# Patient Record
Sex: Female | Born: 1975 | Race: Black or African American | Hispanic: No | Smoking: Current every day smoker
Health system: Southern US, Community
[De-identification: ages and names within clinical notes are randomized; demographics above are authoritative.]

## PROBLEM LIST (undated history)

## (undated) DIAGNOSIS — J40 Bronchitis, not specified as acute or chronic: Secondary | ICD-10-CM

## (undated) DIAGNOSIS — I10 Essential (primary) hypertension: Secondary | ICD-10-CM

---

## 2017-05-27 ENCOUNTER — Encounter (HOSPITAL_COMMUNITY): Payer: Self-pay

## 2017-05-27 ENCOUNTER — Emergency Department (HOSPITAL_COMMUNITY)
Admission: EM | Admit: 2017-05-27 | Discharge: 2017-05-27 | Disposition: A | Discharge status: Home / Self Care | Payer: Self-pay | Attending: Emergency Medicine | Admitting: Emergency Medicine

## 2017-05-27 DIAGNOSIS — F17200 Nicotine dependence, unspecified, uncomplicated: Secondary | ICD-10-CM | POA: Insufficient documentation

## 2017-05-27 DIAGNOSIS — I10 Essential (primary) hypertension: Secondary | ICD-10-CM | POA: Insufficient documentation

## 2017-05-27 DIAGNOSIS — B86 Scabies: Secondary | ICD-10-CM

## 2017-05-27 HISTORY — DX: Essential (primary) hypertension: I10

## 2017-05-27 MED ORDER — DIPHENHYDRAMINE HCL 25 MG PO CAPS
25.0000 mg | ORAL_CAPSULE | Freq: Once | ORAL | Status: AC
  Administered 2017-05-27: 25 mg via ORAL
  Filled 2017-05-27: qty 1

## 2017-05-27 MED ORDER — PERMETHRIN 5 % EX CREA: TOPICAL_CREAM | CUTANEOUS | 0 refills | Status: DC

## 2017-05-27 MED ORDER — DEXAMETHASONE 4 MG PO TABS
10.0000 mg | ORAL_TABLET | Freq: Once | ORAL | Status: AC
  Administered 2017-05-27: 10 mg via ORAL
  Filled 2017-05-27: qty 3

## 2017-05-27 MED ORDER — PREDNISONE 20 MG PO TABS: 60.0000 mg | ORAL_TABLET | Freq: Once | ORAL | Status: DC

## 2017-05-27 MED ORDER — HYDROCHLOROTHIAZIDE 25 MG PO TABS
25.0000 mg | ORAL_TABLET | Freq: Every day | ORAL | Status: DC
  Administered 2017-05-27: 25 mg via ORAL
  Filled 2017-05-27: qty 1

## 2017-05-27 MED ORDER — FAMOTIDINE 20 MG PO TABS: 40.0000 mg | ORAL_TABLET | Freq: Once | ORAL | Status: DC

## 2017-05-27 NOTE — ED Triage Notes (Signed)
Pt reports rash to face, bilateral arms, neck since Thursday after the dust mopped at work. PT denies new laundry detergent, lotion, medication. Endorses new soap 2 weeks. bp 203/100 in triage but pt states she hasnt taken medication in 2 days,

## 2017-05-27 NOTE — Discharge Instructions (Addendum)
Please take medication as prescribed.  Follow attached handout.   Your blood pressure was elevated during today's visit. Please discuss this with your PCP during your follow-up appointment to determine if a medication adjustment/addition is needed for this   If you develop worsening or new concerning symptoms you can return to the emergency department for re-evaluation.

## 2017-05-27 NOTE — ED Provider Notes (Signed)
[facility] EMERGENCY DEPARTMENT Provider Note   CSN: [id number] Arrival date & time: 05/27/17  1214     History   Chief Complaint Chief Complaint  Patient presents with  . Allergic Reaction    HPI Martha Garza is a 42 y.o. female with a history of hypertension who presents emergency department today for rash.  Patient notes that she recently started using a new soap.  Since that time she has been noticing hives that have been occurring on her bilateral arms, chest, neck and occasionally on her face.  She notes that the areas are very pruritic and often worse at night.  She has not been taking anything for symptoms.  Denies fever, chills, contacts with persons with similar rash, exposure to animal or plant irritants. No swelling or purulent discharge. No new medications. No recent travel. No recent tick bites. No involvement to palms/soles or between webspaces.  Patient denies any difficulty breathing, lip swelling, tongue swelling, difficulty swallowing, shortness of breath, chest tightness, abdominal cramping, vomiting or diarrhea.  HPI  Past Medical History:  Diagnosis Date  . Hypertension     There are no active problems to display for this patient.   History reviewed. No pertinent surgical history.   OB History   None      Home Medications    Prior to Admission medications   Not on File    Family History No family history on file.  Social History Social History   Tobacco Use  . Smoking status: Current Every Day Smoker  . Smokeless tobacco: Never Used  Substance Use Topics  . Alcohol use: Yes  . Drug use: Not on file     Allergies   Penicillins   Review of Systems Review of Systems  All other systems reviewed and are negative.    Physical Exam Updated Vital Signs BP (!) 203/100   Pulse 69   Temp 98.3 F (36.8 C) (Oral)   Resp 16   LMP 05/26/2017   SpO2 100%   Physical Exam  Constitutional: She appears  well-developed and well-nourished.  HENT:  Head: Normocephalic and atraumatic.  Right Ear: External ear normal.  Left Ear: External ear normal.  Nose: Nose normal.  Mouth/Throat: Uvula is midline, oropharynx is clear and moist and mucous membranes are normal. No tonsillar exudate.  Patient speaking in full sentences without difficulty.  She is in control of her secretions.  No lip swelling, tongue swelling or uvular swelling.  No angioedema.  Eyes: Pupils are equal, round, and reactive to light. Right eye exhibits no discharge. Left eye exhibits no discharge. No scleral icterus.  Neck: Trachea normal. Neck supple. No spinous process tenderness present. No neck rigidity. Normal range of motion present.  Cardiovascular: Normal rate, regular rhythm and intact distal pulses.  No murmur heard. Pulses:      Radial pulses are 2+ on the right side, and 2+ on the left side.       Dorsalis pedis pulses are 2+ on the right side, and 2+ on the left side.       Posterior tibial pulses are 2+ on the right side, and 2+ on the left side.  No lower extremity swelling or edema. Calves symmetric in size bilaterally.  Pulmonary/Chest: Effort normal and breath sounds normal. She exhibits no tenderness.  No increased work of breathing. No accessory muscle use. Patient is sitting upright, speaking in full sentences without difficulty  Abdominal: Soft. Bowel sounds are normal. There is no  tenderness. There is no rebound and no guarding.  Musculoskeletal: She exhibits no edema.  Lymphadenopathy:    She has no cervical adenopathy.  Neurological: She is alert.  Skin: Skin is warm and dry. No rash noted. She is not diaphoretic.  Patient with psoriasis-like changes on posterior elbows.  She has eczema-like changes on bilateral antecubital areas.  She states this is chronic.  No overlying infectious-like changes.  Patient with burrows between webspaces. Patient with generalized eruption with linear burrows across  bilateral forearms.  This does not involve other areas of the patient's skin.  No blisters, no pustules, no warmth, no draining sinus tracts, no superficial abscesses, no bullous impetigo, no vesicles, no desquamation, no target lesions with dusky purpura or a central bulla. No excoriations.  Not tender to touch.  Psychiatric: She has a normal mood and affect.  Nursing note and vitals reviewed.    ED Treatments / Results  Labs (all labs ordered are listed, but only abnormal results are displayed) Labs Reviewed - No data to display  EKG None  Radiology No results found.  Procedures Procedures (including critical care time)  Medications Ordered in ED Medications  hydrochlorothiazide (HYDRODIURIL) tablet 25 mg (has no administration in time range)     Initial Impression / Assessment and Plan / ED Course  I have reviewed the triage vital signs and the nursing notes.  Pertinent labs & imaging results that were available during my care of the patient were reviewed by me and considered in my medical decision making (see chart for details).     Rash consistent with scabies. Will tx with permethrin. Patient denies any difficulty breathing or swallowing.  Pt has a patent airway without stridor and is handling secretions without difficulty; no angioedema. No blisters, no pustules, no warmth, no draining sinus tracts, no superficial abscesses, no bullous impetigo, no vesicles, no desquamation, no target lesions with dusky purpura or a central bulla. Not tender to touch. No concern for superimposed infection. No concern for SJS, TEN, TSS, tick borne illness, syphilis or other life-threatening condition.  Patient educated on diagnosis.  Will give atarax for itching. I advised the patient to follow-up with PCP this week. Specific return precautions discussed. Time was given for all questions to be answered. The patient verbalized understanding and agreement with plan. The patient appears safe for  discharge home.  Patient was noted to have an elevated blood pressure during their stay in the emergency department. The patient has a history of high blood pressure.  Patient has not taking her blood pressure medication this morning.  She denies any headache, chest pain, shortness of breath or neurologic symptoms.  Patient without any focal neurologic deficits. Patient given dose of bp medication while in the department. I advised her to make sure she takes bp medication as prescribed. The patient to discuss this with their PCP during her follow up visit to decide if medication management is needed for this. Patient does not need refill on her medication   Final Clinical Impressions(s) / ED Diagnoses   Final diagnoses:  Scabies    ED Discharge Orders        Ordered    permethrin (ELIMITE) 5 % cream     05/27/17 1529       Lorelle Gibbs 05/27/17 1607    Gareth Morgan, MD 05/29/17 2253

## 2017-11-05 ENCOUNTER — Other Ambulatory Visit: Payer: Self-pay

## 2017-11-05 ENCOUNTER — Encounter (HOSPITAL_BASED_OUTPATIENT_CLINIC_OR_DEPARTMENT_OTHER): Payer: Self-pay | Admitting: Emergency Medicine

## 2017-11-05 ENCOUNTER — Emergency Department (HOSPITAL_BASED_OUTPATIENT_CLINIC_OR_DEPARTMENT_OTHER): Payer: Self-pay

## 2017-11-05 ENCOUNTER — Emergency Department (HOSPITAL_BASED_OUTPATIENT_CLINIC_OR_DEPARTMENT_OTHER)
Admission: EM | Admit: 2017-11-05 | Discharge: 2017-11-05 | Disposition: A | Discharge status: Home / Self Care | Payer: Self-pay | Attending: Emergency Medicine | Admitting: Emergency Medicine

## 2017-11-05 DIAGNOSIS — B9789 Other viral agents as the cause of diseases classified elsewhere: Secondary | ICD-10-CM | POA: Insufficient documentation

## 2017-11-05 DIAGNOSIS — J069 Acute upper respiratory infection, unspecified: Secondary | ICD-10-CM

## 2017-11-05 DIAGNOSIS — F17200 Nicotine dependence, unspecified, uncomplicated: Secondary | ICD-10-CM | POA: Insufficient documentation

## 2017-11-05 DIAGNOSIS — I10 Essential (primary) hypertension: Secondary | ICD-10-CM | POA: Insufficient documentation

## 2017-11-05 DIAGNOSIS — J40 Bronchitis, not specified as acute or chronic: Secondary | ICD-10-CM

## 2017-11-05 HISTORY — DX: Bronchitis, not specified as acute or chronic: J40

## 2017-11-05 MED ORDER — ALBUTEROL SULFATE (2.5 MG/3ML) 0.083% IN NEBU
5.0000 mg | INHALATION_SOLUTION | Freq: Once | RESPIRATORY_TRACT | Status: AC
  Administered 2017-11-05: 5 mg via RESPIRATORY_TRACT
  Filled 2017-11-05: qty 6

## 2017-11-05 MED ORDER — PREDNISONE 10 MG PO TABS
60.0000 mg | ORAL_TABLET | Freq: Once | ORAL | Status: AC
  Administered 2017-11-05: 60 mg via ORAL
  Filled 2017-11-05: qty 1

## 2017-11-05 MED ORDER — PREDNISONE 10 MG PO TABS: 50.0000 mg | ORAL_TABLET | Freq: Every day | ORAL | 0 refills | Status: DC

## 2017-11-05 NOTE — ED Triage Notes (Addendum)
Pt reports one week of cold symptoms with shortness of breath and chest pain. Pt seen at [facility] online and was prescribed medications. Pt continued to have chest pain the online MD changed the antibiotic. Pt continues to have shortness of breath and chest pain that is worse at night. Pt has not taking her BP meds in a year, pt does not have a PCP.

## 2017-11-05 NOTE — Discharge Instructions (Signed)
Take the medications prescribed.  Continue using albuterol every 4 hours for the next 2 to 3 days.  Take additional treatments if you are having shortness of breath in between.  Also finished the course of prednisone that has been prescribed.  Return to the ER immediately if you start having worsening of your symptoms.

## 2017-11-05 NOTE — ED Notes (Signed)
ED Provider at bedside. 

## 2017-11-10 NOTE — ED Provider Notes (Signed)
[facility] EMERGENCY DEPARTMENT Provider Note   CSN: [id number] Arrival date & time: 11/05/17  1127     History   Chief Complaint Chief Complaint  Patient presents with  . Cough  . Chest Pain    HPI Martha Garza is a 42 y.o. female.  HPI  42 year old [place] with history of bronchitis and hypertension comes in with chief complaint of cough.  Patient reports 1 week of cold-like symptoms.  She is also been having associated shortness of breath and chest discomfort.  With the cough there has been some clear and yellow phlegm.  Patient has seen her PCP and prescribed medication that she has been taking, including antibiotics.  Patient continues to have the chest pain and shortness of breath.  Pt has no hx of PE, DVT and denies any exogenous hormone (testosterone / estrogen) use, long distance travels or surgery in the past 6 weeks, active cancer, recent immobilization.   Past Medical History:  Diagnosis Date  . Bronchitis   . Hypertension     There are no active problems to display for this patient.   History reviewed. No pertinent surgical history.   OB History   None      Home Medications    Prior to Admission medications   Medication Sig Start Date End Date Taking? Authorizing Provider  permethrin (ELIMITE) 5 % cream Apply to affected area once Apply from neck down Leave on for 8-12hr before washing off 05/27/17   Maczis, Barth Kirks, PA-C  predniSONE (DELTASONE) 10 MG tablet Take 5 tablets (50 mg total) by mouth daily. 11/05/17   Varney Biles, MD    Family History No family history on file.  Social History Social History   Tobacco Use  . Smoking status: Current Every Day Smoker  . Smokeless tobacco: Never Used  Substance Use Topics  . Alcohol use: Yes  . Drug use: Never     Allergies   Penicillins   Review of Systems Review of Systems  Constitutional: Positive for activity change.  HENT: Positive for congestion and rhinorrhea.     Eyes: Negative for visual disturbance.  Respiratory: Positive for shortness of breath.   Cardiovascular: Positive for chest pain.  Allergic/Immunologic: Negative for immunocompromised state.  All other systems reviewed and are negative.    Physical Exam Updated Vital Signs BP (!) 183/72 (BP Location: Right Arm) Comment: Pt recently completed neb treatment  Pulse 79   Temp 98.7 F (37.1 C) (Oral)   Resp 18   Ht 5\' 5"  (1.651 m)   Wt 108.9 kg   LMP 10/18/2017   SpO2 98%   BMI 39.94 kg/m   Physical Exam  Constitutional: She is oriented to person, place, and time. She appears well-developed.  HENT:  Head: Normocephalic and atraumatic.  Eyes: EOM are normal.  Neck: Normal range of motion. Neck supple.  Cardiovascular: Normal rate, intact distal pulses and normal pulses.  Pulmonary/Chest: Effort normal. She has no decreased breath sounds. She has no wheezes. She has no rhonchi. She has no rales.  Abdominal: Bowel sounds are normal.  Neurological: She is alert and oriented to person, place, and time.  Skin: Skin is warm and dry.  Nursing note and vitals reviewed.    ED Treatments / Results  Labs (all labs ordered are listed, but only abnormal results are displayed) Labs Reviewed - No data to display  EKG EKG Interpretation  Date/Time:  Sunday November 05 2017 11:48:21 EDT Ventricular Rate:  75 PR Interval:  166 QRS Duration: 84 QT Interval:  408 QTC Calculation: 455 R Axis:   56 Text Interpretation:  Normal sinus rhythm Normal ECG No acute changes No old tracing to compare Confirmed by Varney Biles [phone]) on 11/05/2017 1:41:14 PM   Radiology No results found.  Procedures Procedures (including critical care time)  Medications Ordered in ED Medications  albuterol (PROVENTIL) (2.5 MG/3ML) 0.083% nebulizer solution 5 mg (5 mg Nebulization Given 11/05/17 1408)  predniSONE (DELTASONE) tablet 60 mg (60 mg Oral Given 11/05/17 1404)     Initial Impression /  Assessment and Plan / ED Course  I have reviewed the triage vital signs and the nursing notes.  Pertinent labs & imaging results that were available during my care of the patient were reviewed by me and considered in my medical decision making (see chart for details).     DDX includes: Viral syndrome Influenza Pharyngitis Sinusitis Electrolyte abnormality  Patient appears stable.  She has congestion and URI-like symptoms.  X-ray is not showing pneumonia.  We will not change her antibiotics for the regimen.  Final Clinical Impressions(s) / ED Diagnoses   Final diagnoses:  Viral URI with cough  Bronchitis    ED Discharge Orders         Ordered    predniSONE (DELTASONE) 10 MG tablet  Daily     11/05/17 1416           Varney Biles, MD 11/10/17 0041

## 2018-02-07 ENCOUNTER — Encounter (HOSPITAL_BASED_OUTPATIENT_CLINIC_OR_DEPARTMENT_OTHER): Payer: Self-pay | Admitting: *Deleted

## 2018-02-07 ENCOUNTER — Emergency Department (HOSPITAL_BASED_OUTPATIENT_CLINIC_OR_DEPARTMENT_OTHER): Payer: Self-pay

## 2018-02-07 ENCOUNTER — Emergency Department (HOSPITAL_BASED_OUTPATIENT_CLINIC_OR_DEPARTMENT_OTHER)
Admission: EM | Admit: 2018-02-07 | Discharge: 2018-02-07 | Disposition: A | Discharge status: Home / Self Care | Payer: Self-pay | Attending: Emergency Medicine | Admitting: Emergency Medicine

## 2018-02-07 ENCOUNTER — Other Ambulatory Visit: Payer: Self-pay

## 2018-02-07 DIAGNOSIS — I10 Essential (primary) hypertension: Secondary | ICD-10-CM | POA: Insufficient documentation

## 2018-02-07 DIAGNOSIS — F17200 Nicotine dependence, unspecified, uncomplicated: Secondary | ICD-10-CM | POA: Insufficient documentation

## 2018-02-07 DIAGNOSIS — B9789 Other viral agents as the cause of diseases classified elsewhere: Secondary | ICD-10-CM | POA: Insufficient documentation

## 2018-02-07 DIAGNOSIS — J069 Acute upper respiratory infection, unspecified: Secondary | ICD-10-CM

## 2018-02-07 DIAGNOSIS — J40 Bronchitis, not specified as acute or chronic: Secondary | ICD-10-CM

## 2018-02-07 MED ORDER — PREDNISONE 20 MG PO TABS: 40.0000 mg | ORAL_TABLET | Freq: Every day | ORAL | 0 refills | Status: AC

## 2018-02-07 MED ORDER — PREDNISONE 50 MG PO TABS
60.0000 mg | ORAL_TABLET | Freq: Once | ORAL | Status: AC
  Administered 2018-02-07: 60 mg via ORAL
  Filled 2018-02-07: qty 1

## 2018-02-07 MED ORDER — ALBUTEROL SULFATE HFA 108 (90 BASE) MCG/ACT IN AERS
2.0000 | INHALATION_SPRAY | Freq: Once | RESPIRATORY_TRACT | Status: AC
  Administered 2018-02-07: 2 via RESPIRATORY_TRACT
  Filled 2018-02-07: qty 6.7

## 2018-02-07 NOTE — ED Triage Notes (Signed)
C/o cough, congestion body aches onset 01/24/18  Woke today w rt side cp, started  On z pak yesterday

## 2018-02-07 NOTE — Discharge Instructions (Signed)
We are prescribing 40 mg of prednisone that you will start tomorrow. Continue with albuterol as needed for your wheezing. You can do ibuprofen, naproxen or tylenol for the chest pain. You can stop the Zpack given normal chest x ray. Follow up with your primary care provider as needed.

## 2018-02-07 NOTE — ED Provider Notes (Signed)
[facility] EMERGENCY DEPARTMENT Provider Note   CSN: [id number] Arrival date & time: 02/07/18  3500    History   Chief Complaint Chief Complaint  Patient presents with  . Cough    HPI Martha Garza is a 43 y.o. female with a past medical history significant for HTN who presents today for chest pain. Patient reports she woke up this morning with pain in the middle of her chest. Patient states she has had a bad cough for the past two weeks worse at night. She was diagnosed with the flu on christmas day. She has been taking tylenol, robitussin for her cough, Mucinex for her cough and albuterol as needed for wheezing. She reports she was prescribed  Z-pack yesterday through  Telemedicine and has taken the first dose yesterday. Her chest pain is non radiating, and is not associated with exertion, shortness of breath, diaphoresis, abdominal pain or dizziness   HPI  Past Medical History:  Diagnosis Date  . Bronchitis   . Hypertension     There are no active problems to display for this patient.   No past surgical history on file.   OB History   No obstetric history on file.      Home Medications    Prior to Admission medications   Medication Sig Start Date End Date Taking? Authorizing Provider  permethrin (ELIMITE) 5 % cream Apply to affected area once Apply from neck down Leave on for 8-12hr before washing off 05/27/17   Maczis, Barth Kirks, PA-C  predniSONE (DELTASONE) 10 MG tablet Take 5 tablets (50 mg total) by mouth daily. 11/05/17   Varney Biles, MD  predniSONE (DELTASONE) 20 MG tablet Take 2 tablets (40 mg total) by mouth daily for 4 days. 02/07/18 02/11/18  Marjie Skiff, MD    Family History No family history on file.  Social History Social History   Tobacco Use  . Smoking status: Current Every Day Smoker  . Smokeless tobacco: Never Used  Substance Use Topics  . Alcohol use: Yes  . Drug use: Never     Allergies   Penicillins   Review of  Systems Review of Systems  Constitutional: Positive for fatigue.  HENT: Positive for congestion and postnasal drip.   Eyes: Negative.   Respiratory: Positive for wheezing.   Cardiovascular: Positive for chest pain.  Gastrointestinal: Negative.   Endocrine: Negative.   Genitourinary: Negative.   Musculoskeletal: Negative.     Physical Exam Updated Vital Signs BP (!) 184/102 (BP Location: Right Arm)   Pulse 73   Temp 98 F (36.7 C) (Oral)   Resp 18   Ht 5\' 5"  (1.651 m)   Wt 105.7 kg   LMP 01/24/2018 (Exact Date)   SpO2 100%   BMI 38.77 kg/m   Physical Exam Constitutional:      Appearance: Normal appearance. She is normal weight.  HENT:     Head: Normocephalic.     Right Ear: Tympanic membrane normal.     Left Ear: Tympanic membrane normal.     Nose: Nose normal.     Mouth/Throat:     Mouth: Mucous membranes are moist.     Pharynx: Oropharynx is clear.  Eyes:     Conjunctiva/sclera: Conjunctivae normal.     Pupils: Pupils are equal, round, and reactive to light.  Neck:     Musculoskeletal: Normal range of motion.  Cardiovascular:     Rate and Rhythm: Normal rate and regular rhythm.     Pulses: Normal pulses.  Heart sounds: Normal heart sounds.  Pulmonary:     Effort: Pulmonary effort is normal.     Breath sounds: Normal breath sounds. No stridor. No wheezing.  Abdominal:     General: Abdomen is flat.     Palpations: Abdomen is soft.  Musculoskeletal:     Comments: Tenderness to palpation around sternum  Skin:    General: Skin is warm.     Capillary Refill: Capillary refill takes less than 2 seconds.  Neurological:     General: No focal deficit present.     Mental Status: She is alert and oriented to person, place, and time.      ED Treatments / Results  Labs (all labs ordered are listed, but only abnormal results are displayed) Labs Reviewed - No data to display  EKG EKG Interpretation  Date/Time:  Wednesday February 07 2018 08:12:16  EST Ventricular Rate:  73 PR Interval:    QRS Duration: 94 QT Interval:  402 QTC Calculation: 443 R Axis:   46 Text Interpretation:  Sinus rhythm Borderline T wave abnormalities No significant change since last tracing Confirmed by Gareth Morgan [phone]) on 02/07/2018 8:25:22 AM   Radiology Dg Chest 2 View  Result Date: 02/07/2018 CLINICAL DATA:  Cough, congestion and body aches. EXAM: CHEST - 2 VIEW COMPARISON:  11/05/2017 FINDINGS: The heart size and mediastinal contours are within normal limits. There is no evidence of pulmonary edema, consolidation, pneumothorax, nodule or pleural fluid. The visualized skeletal structures are unremarkable. IMPRESSION: No active cardiopulmonary disease. Electronically Signed   By: Aletta Edouard M.D.   On: 02/07/2018 08:06    Procedures Procedures (including critical care time)  Medications Ordered in ED Medications  albuterol (PROVENTIL HFA;VENTOLIN HFA) 108 (90 Base) MCG/ACT inhaler 2 puff (2 puffs Inhalation Given 02/07/18 0841)  predniSONE (DELTASONE) tablet 60 mg (60 mg Oral Given 02/07/18 0836)     Initial Impression / Assessment and Plan / ED Course  I have reviewed the triage vital signs and the nursing notes.  Pertinent labs & imaging results that were available during my care of the patient were reviewed by me and considered in my medical decision making (see chart for details).   Patient is 43 yo female who present today for chest pain. Patient was diagnosed with flu 2 weeks ago and has since had persistent cough with congestion that have gradually worsens in the past few days. Patient was started on azithromycin yesterday but denies any constitutional symptoms such as fever. On exam chest pain is reproducible with palpation. Her chest pain is not associated with exertion, diaphoresis, SOB. CXR was negative for any cardiopulmonary process. EKG showed normal sinus rhythm. Patient does endorses some wheezing for which she has been using  albuterol intermittently. Based on symptoms on presentation, exam and imaging findings, pain is likely to MSK in nature given prolonged cough. Less likely to be cardiac in etiology given reassuring testing and normal vitals. Patient was given another albuterol inhaler and steroid prior to discharge and will complete a 5 days course of prednisone given smoking history with probably some underlying mild COPD.  Final Clinical Impressions(s) / ED Diagnoses   Final diagnoses:  Viral URI with cough  Bronchitis    ED Discharge Orders         Ordered    predniSONE (DELTASONE) 20 MG tablet  Daily     02/07/18 0844           Marjie Skiff, MD 02/07/18 6144    Billy Fischer,  Junie Panning, MD 02/09/18 1022

## 2018-03-29 ENCOUNTER — Encounter: Payer: Self-pay | Admitting: Allergy

## 2018-03-29 ENCOUNTER — Ambulatory Visit: Payer: Self-pay | Admitting: Allergy

## 2018-03-29 ENCOUNTER — Other Ambulatory Visit: Payer: Self-pay

## 2018-03-29 VITALS — BP 160/100 | HR 77 | Resp 16 | Ht 65.0 in | Wt 243.4 lb

## 2018-03-29 DIAGNOSIS — T781XXA Other adverse food reactions, not elsewhere classified, initial encounter: Secondary | ICD-10-CM | POA: Insufficient documentation

## 2018-03-29 DIAGNOSIS — T50905A Adverse effect of unspecified drugs, medicaments and biological substances, initial encounter: Secondary | ICD-10-CM | POA: Insufficient documentation

## 2018-03-29 DIAGNOSIS — T50905D Adverse effect of unspecified drugs, medicaments and biological substances, subsequent encounter: Secondary | ICD-10-CM

## 2018-03-29 DIAGNOSIS — L299 Pruritus, unspecified: Secondary | ICD-10-CM

## 2018-03-29 DIAGNOSIS — R21 Rash and other nonspecific skin eruption: Secondary | ICD-10-CM

## 2018-03-29 DIAGNOSIS — T781XXD Other adverse food reactions, not elsewhere classified, subsequent encounter: Secondary | ICD-10-CM

## 2018-03-29 DIAGNOSIS — L409 Psoriasis, unspecified: Secondary | ICD-10-CM | POA: Insufficient documentation

## 2018-03-29 DIAGNOSIS — Z720 Tobacco use: Secondary | ICD-10-CM | POA: Insufficient documentation

## 2018-03-29 DIAGNOSIS — J4540 Moderate persistent asthma, uncomplicated: Secondary | ICD-10-CM

## 2018-03-29 HISTORY — DX: Pruritus, unspecified: L29.9

## 2018-03-29 NOTE — Patient Instructions (Addendum)
Today's skin testing showed: Negative to environmental allergies and basic foods including pork but your positive control was negative as well.  Get bloodwork CBC diff, CMP, ESR, CRP, Thyroid cascade, ANA w/reflex, C3, C4, alpha gal, IgE zone 2   Start zyrtec 39m daily and monitor your symptoms. May increase up to twice a day if needed.  Food: Continue continue to avoid foods that bother you such as that red sausage and lo mein noodles. Keep a food journal and see if you can notice a correlation between foods and symptoms. For mild symptoms you can take over the counter antihistamines such as Benadryl and monitor symptoms closely. If symptoms worsen or if you have severe symptoms including breathing issues, throat closure, significant swelling, whole body hives, severe diarrhea and vomiting, lightheadedness then seek immediate medical care.  Breathing: Stop smoking. . Daily controller medication(s): Start Breo 100 1 puff daily and rinse mouth afterwards. . Prior to physical activity: May use albuterol rescue inhaler 2 puffs 5 to 15 minutes prior to strenuous physical activities. .Marland KitchenRescue medications: May use albuterol rescue inhaler 2 puffs or nebulizer every 4 to 6 hours as needed for shortness of breath, chest tightness, coughing, and wheezing. Monitor frequency of use.  . Asthma control goals:  o Full participation in all desired activities (may need albuterol before activity) o Albuterol use two times or less a week on average (not counting use with activity) o Cough interfering with sleep two times or less a month o Oral steroids no more than once a year o No hospitalizations   Drug allergies: Continue to avoid losartan, penicillin and permethrin  Follow up in 2 months   Skin care recommendations  Bath time: . Always use lukewarm water. AVOID very hot or cold water. .Marland KitchenKeep bathing time to 5-10 minutes. . Do NOT use bubble bath. . Use a mild soap and use just enough to wash  the dirty areas. . Do NOT scrub skin vigorously.  . After bathing, pat dry your skin with a towel. Do NOT rub or scrub the skin.  Moisturizers and prescriptions:  . ALWAYS apply moisturizers immediately after bathing (within 3 minutes). This helps to lock-in moisture. . Use the moisturizer several times a day over the whole body. .Kermit Balosummer moisturizers include: Aveeno, CeraVe, Cetaphil. .Kermit Balowinter moisturizers include: Aquaphor, Vaseline, Cerave, Cetaphil, Eucerin, Vanicream. . When using moisturizers along with medications, the moisturizer should be applied about one hour after applying the medication to prevent diluting effect of the medication or moisturize around where you applied the medications. When not using medications, the moisturizer can be continued twice daily as maintenance.  Laundry and clothing: . Avoid laundry products with added color or perfumes. . Use unscented hypo-allergenic laundry products such as Tide free, Cheer free & gentle, and All free and clear.  . If the skin still seems dry or sensitive, you can try double-rinsing the clothes. . Avoid tight or scratchy clothing such as wool. . Do not use fabric softeners or dyer sheets.

## 2018-03-29 NOTE — Progress Notes (Signed)
New Patient Note  RE: Martha Garza MRN: [id number] DOB: 09/17/1975 Date of Office Visit: 03/29/2018  Referring provider: No ref. provider found Primary care provider: Patient, No Pcp Per  Chief Complaint: Allergic Reaction (dust, environmental); Food Intolerance ([place] slims, noodles, MSG ); and Allergic Reaction (PCN in past )  History of Present Illness: I had the pleasure of seeing Fatoumata Albaugh for initial evaluation at the [facility] on 03/30/2018. She is a 43 y.o. female, who is self-referred here for the evaluation of allergies. She is accompanied today by her husband who provided/contributed to the history.   Dust: Patient was at work when she had a flare of her rash. They were dusting above where she was working and as the dust was coming down she had noticed some rash and pruritus on her arms.This episode lasted about 3-4 weeks and has some scarring on her arms from it. She used some type of topical steroid cream which helped.  Minimal rhino conjunctivitis symptoms. She also tried benadryl with no improvement in symptoms. Sinus infections: 3-4. Previous work up includes: none. Previous ENT evaluation: no.  Eczema: Patient had eczema for the past 20+ years and usually flares on the legs and arms. No specific triggers noted but the dust definitely made it worse. She tried topical steroid creams with some benefit.  Patient noticed that she would also break out after eating lo mein noodles. This would happen about 20 minutes after ingestion. This usually resolves within a few days. Denies any other symptoms. Not sure what's in lo mein noodles that's making her break out.  She also had similar reactions after eating [place] red sausage. She eats pork with no issues. No recent tick bites.   Past work up includes: none Dietary History: patient has been eating other foods including milk, eggs, limited peanut, treenuts, limited sesame, shellfish, seafood, wheat,  meats, fruits and vegetables.   She reports reading labels and avoiding [place] red sausage in diet completely. Patient has not had any recent physical exam or recent bloodwork since she moved to [place] about 2 years ago.   Assessment and Plan: Geet is a 43 y.o. female with: Rash and other nonspecific skin eruption Rash and pruritus flare after dust exposure in the setting of 20+ year history of eczema. Tried topical steroid cream with some benefit. Triggers noted: lo mein noodles, [place] red sausage and dust. Patient concerned about food and environmental allergies.  Today's skin testing showed: Negative to environmental allergies and basic foods including pork but the positive control was negative as well questioning the validity of the results. Patient denies taking any antihistamines the last 3 days.   Get bloodwork as below to rule out other etiologies.   Start zyrtec 46m daily and monitor symptoms. May increase up to twice a day if needed.  Continue to avoid foods that bother her such as that red sausage and lo mein noodles.  Keep a food journal and see if she can notice a correlation between foods and symptoms.  For mild symptoms she can take over the counter antihistamines such as Benadryl and monitor symptoms closely. If symptoms worsen or if she has severe symptoms including breathing issues, throat closure, significant swelling, whole body hives, severe diarrhea and vomiting, lightheadedness then seek immediate medical care.  Discussed proper skin care measures.  Pruritus See assessment and plan as above.  Moderate persistent asthma without complication No formal diagnosis of asthma but gets respiratory issues with bronchitis which  requires albuterol use that helps.  Today's spirometry showed: 72% improvement in FEV1 post bronchodilator treatment.  Discussed smoking cessation.  . Daily controller medication(s): Start Breo 100 1 puff daily and rinse mouth afterwards.  Sample given.  . Prior to physical activity: May use albuterol rescue inhaler 2 puffs 5 to 15 minutes prior to strenuous physical activities. Marland Kitchen Rescue medications: May use albuterol rescue inhaler 2 puffs or nebulizer every 4 to 6 hours as needed for shortness of breath, chest tightness, coughing, and wheezing. Monitor frequency of use.   Adverse food reaction  Continue to avoid foods that bother her such as that red sausage and lo mein noodles.  Keep a food journal and see if she can notice a correlation between foods and symptoms.  For mild symptoms she can take over the counter antihistamines such as Benadryl and monitor symptoms closely. If symptoms worsen or if she has severe symptoms including breathing issues, throat closure, significant swelling, whole body hives, severe diarrhea and vomiting, lightheadedness then seek immediate medical care.  Drug reaction Broke out in hives from penicillin, losartan and Elimite in the past.  Continue to avoid. Consider penicillin testing in future.   Return in about 2 months (around 05/28/2018).  Lab Orders     CBC With Differential     Comprehensive metabolic panel     Sed Rate (ESR)     C-reactive protein     Thyroid Cascade Profile     ANA w/Reflex     C3 and C4     Alpha-Gal Panel     Allergens w/Total IgE Area 2  Other allergy screening: Asthma: no  Patient gets chest pains and coughing with bronchitis. Uses albuterol with good benefit.  Medication allergy: yes  Penicillin - hives Losartan - hives Elimite - hives  Hymenoptera allergy: no Urticaria: yes Eczema:yes History of recurrent infections suggestive of immunodeficency: no  Diagnostics: Spirometry:  Tracings reviewed. Her effort: It was hard to get consistent efforts and there is a question as to whether this reflects a maximal maneuver. FVC: 1.39L FEV1: 1.11L, 42% predicted FEV1/FVC ratio: 80% Interpretation: 72% improvement in FEV1 post bronchodilator treatment.    Please see scanned spirometry results for details.  Skin Testing: Environmental allergy panel and select foods. All allergens including positive control questioning the validity of the results.  Results discussed with patient/family. Airborne Adult Perc - 03/29/18 1447    Time Antigen Placed  1447    Allergen Manufacturer  Lavella Hammock    Location  Back    Number of Test  59    Panel 1  Select    1. Control-Buffer 50% Glycerol  Negative    2. Control-Histamine 1 mg/ml  Negative    3. Albumin saline  Negative    4. [place]  Negative    5. [place]  Negative    6. [place]  Negative    7. [place] Blue  Negative    8. Meadow Fescue  Negative    9. Perennial Rye  Negative    10. Sweet Vernal  Negative    11. Timothy  Negative    12. Cocklebur  Negative    13. Burweed Marshelder  Negative    14. Ragweed, short  Negative    15. Ragweed, Giant  Negative    16. Plantain,  English  Negative    17. Lamb's Quarters  Negative    18. Sheep Sorrell  Negative    19. Rough Pigweed  Negative  [place], Rough  Negative    21. Mugwort, Common  Negative    22. Ash mix  Negative    23. Birch mix  Negative    24. Beech American  Negative    25. Box, Elder  Negative    26. Cedar, red  Negative    27. Cottonwood, [place]  Negative    28. Elm mix  Negative    29. Hickory mix  Negative    30. Maple mix  Negative    31. Oak, [place] mix  Negative    32. Pecan Pollen  Negative    33. Pine mix  Negative    34. Sycamore Eastern  Negative    35. [place], Black Pollen  Negative    36. Alternaria alternata  Negative    37. Cladosporium Herbarum  Negative    38. Aspergillus mix  Negative    39. Penicillium mix  Negative    40. Bipolaris sorokiniana (Helminthosporium)  Negative    41. Drechslera spicifera (Curvularia)  Negative    42. Mucor plumbeus  Negative    43. Fusarium moniliforme  Negative    44. Aureobasidium pullulans (pullulara)  Negative    45. Rhizopus oryzae  Negative    46.  Botrytis cinera  Negative    47. Epicoccum nigrum  Negative    48. Phoma betae  Negative    49. Candida Albicans  Negative    50. Trichophyton mentagrophytes  Negative    51. Mite, D Farinae  5,000 AU/ml  Negative    52. Mite, D Pteronyssinus  5,000 AU/ml  Negative    53. Cat Hair 10,000 BAU/ml  Negative    54.  Dog Epithelia  Negative    55. Mixed Feathers  Negative    56. Horse Epithelia  Negative    57. Cockroach, German  Negative    58. Mouse  Negative    59. Tobacco Leaf  Negative     Food Adult Perc - 03/29/18 1400    Time Antigen Placed  1448    Allergen Manufacturer  Lavella Hammock    Location  Back    Number of allergen test  11   Control-Histamine 1 mg/ml  Negative    1. Peanut  Negative    2. Soybean  Negative    3. Wheat  Negative    4. Sesame  Negative    5. Milk, cow  Negative    6. Egg White, Chicken  Negative    7. Casein  Negative    8. Shellfish Mix  Negative    9. Fish Mix  Negative    10. Cashew  Negative    37. Pork  Negative       Past Medical History: Patient Active Problem List   Diagnosis Date Noted  . Moderate persistent asthma without complication 24/82/5003  . Rash and other nonspecific skin eruption 03/29/2018  . Tobacco user 03/29/2018  . Pruritus 03/29/2018  . Adverse food reaction 03/29/2018  . Drug reaction 03/29/2018   Past Medical History:  Diagnosis Date  . Bronchitis   . Hypertension    Past Surgical History: History reviewed. No pertinent surgical history. Medication List:  Current Outpatient Medications  Medication Sig Dispense Refill  . albuterol (PROVENTIL HFA;VENTOLIN HFA) 108 (90 Base) MCG/ACT inhaler INHALE 2 TO 4 PUFFS INTO THE LUNGS EVERY 4 TO 6 HOURS AS NEEDED FOR COUGH WHEEZE OR CHEST TIGHTNESS    . Multiple Vitamins-Minerals (HAIR SKIN AND NAILS FORMULA) TABS  Take by mouth.     No current facility-administered medications for this visit.    Allergies: Allergies  Allergen Reactions  . Elimite [Permethrin] Hives  .  Losartan Hives  . Penicillins Hives   Social History: Social History   Socioeconomic History  . Marital status: Married    Spouse name: Not on file  . Number of children: Not on file  . Years of education: Not on file  . Highest education level: Not on file  Occupational History  . Not on file  Social Needs  . Financial resource strain: Not on file  . Food insecurity:    Worry: Not on file    Inability: Not on file  . Transportation needs:    Medical: Not on file    Non-medical: Not on file  Tobacco Use  . Smoking status: Current Every Day Smoker    Packs/day: 1.00    Years: 23.00    Pack years: 23.00    Types: Cigarettes  . Smokeless tobacco: Never Used  Substance and Sexual Activity  . Alcohol use: Never    Frequency: Never  . Drug use: Never  . Sexual activity: Not on file  Lifestyle  . Physical activity:    Days per week: Not on file    Minutes per session: Not on file  . Stress: Not on file  Relationships  . Social connections:    Talks on phone: Not on file    Gets together: Not on file    Attends religious service: Not on file    Active member of club or organization: Not on file    Attends meetings of clubs or organizations: Not on file    Relationship status: Not on file  Other Topics Concern  . Not on file  Social History Narrative  . Not on file   Lives in a 41 year old home. Smoking: 1/3 pack/day x 23 yrs. Occupation: works in Geophysicist/field seismologist History: Environmental education officer in the house: no Charity fundraiser in the family room: yes Carpet in the bedroom: yes Heating: gas Cooling: central Pet: yes 1 dog x 3 yrs  Family History: Family History  Problem Relation Age of Onset  . Cancer Mother   . Cancer Father   . Cancer Niece    Problem                               Relation Asthma                                   No  Eczema                                No  Food allergy                          No  Allergic rhino conjunctivitis     No    Review of Systems  Constitutional: Negative for appetite change, chills, fever and unexpected weight change.  HENT: Negative for congestion and rhinorrhea.   Eyes: Negative for itching.  Respiratory: Negative for cough, chest tightness, shortness of breath and wheezing.   Cardiovascular: Negative for chest pain.  Gastrointestinal: Negative for abdominal pain.  Genitourinary: Negative for difficulty urinating.  Skin: Positive for rash.  Allergic/Immunologic: Negative for environmental allergies and food allergies.  Neurological: Negative for headaches.   Objective: BP (!) 160/100   Pulse 77   Resp 16   Ht '5\' 5"'  (1.651 m)   Wt 243 lb 6.4 oz (110.4 kg)   SpO2 98%   BMI 40.50 kg/m  Body mass index is 40.5 kg/m. Physical Exam  Constitutional: She is oriented to person, place, and time. She appears well-developed and well-nourished.  HENT:  Head: Normocephalic and atraumatic.  Right Ear: External ear normal.  Left Ear: External ear normal.  Nose: Nose normal.  Mouth/Throat: Oropharynx is clear and moist.  Poor dentition  Eyes: Conjunctivae and EOM are normal.  Neck: Neck supple.  Cardiovascular: Normal rate, regular rhythm and normal heart sounds. Exam reveals no gallop and no friction rub.  No murmur heard. Pulmonary/Chest: Effort normal and breath sounds normal. She has no wheezes. She has no rales.  Abdominal: Soft.  Lymphadenopathy:    She has no cervical adenopathy.  Neurological: She is alert and oriented to person, place, and time.  Skin: Skin is warm. Rash noted.  Hyperpigmented areas on upper extremities b/l.  Psychiatric: She has a normal mood and affect. Her behavior is normal.  Nursing note and vitals reviewed.  The plan was reviewed with the patient/family, and all questions/concerned were addressed.  It was my pleasure to see Carinne today and participate in her care. Please feel free to contact me with any questions or concerns.  Sincerely,  Rexene Alberts,  DO Allergy & Immunology  [facility] office: [phone] [facility] office: [phone]

## 2018-03-30 ENCOUNTER — Encounter: Payer: Self-pay | Admitting: Allergy

## 2018-03-30 DIAGNOSIS — J4540 Moderate persistent asthma, uncomplicated: Secondary | ICD-10-CM | POA: Insufficient documentation

## 2018-03-30 NOTE — Assessment & Plan Note (Signed)
   Continue to avoid foods that bother her such as that red sausage and lo mein noodles.  Keep a food journal and see if she can notice a correlation between foods and symptoms.  For mild symptoms she can take over the counter antihistamines such as Benadryl and monitor symptoms closely. If symptoms worsen or if she has severe symptoms including breathing issues, throat closure, significant swelling, whole body hives, severe diarrhea and vomiting, lightheadedness then seek immediate medical care.

## 2018-03-30 NOTE — Assessment & Plan Note (Signed)
Broke out in hives from penicillin, losartan and Elimite in the past.  Continue to avoid. Consider penicillin testing in future.

## 2018-03-30 NOTE — Assessment & Plan Note (Signed)
No formal diagnosis of asthma but gets respiratory issues with bronchitis which requires albuterol use that helps.  Today's spirometry showed: 72% improvement in FEV1 post bronchodilator treatment.  Discussed smoking cessation.  . Daily controller medication(s): Start Breo 100 1 puff daily and rinse mouth afterwards. Sample given.  . Prior to physical activity: May use albuterol rescue inhaler 2 puffs 5 to 15 minutes prior to strenuous physical activities. Marland Kitchen Rescue medications: May use albuterol rescue inhaler 2 puffs or nebulizer every 4 to 6 hours as needed for shortness of breath, chest tightness, coughing, and wheezing. Monitor frequency of use.

## 2018-03-30 NOTE — Assessment & Plan Note (Signed)
.   See assessment and plan as above. 

## 2018-03-30 NOTE — Assessment & Plan Note (Addendum)
Rash and pruritus flare after dust exposure in the setting of 20+ year history of eczema. Tried topical steroid cream with some benefit. Triggers noted: lo mein noodles, [place] red sausage and dust. Patient concerned about food and environmental allergies.  Today's skin testing showed: Negative to environmental allergies and basic foods including pork but the positive control was negative as well questioning the validity of the results. Patient denies taking any antihistamines the last 3 days.   Get bloodwork as below to rule out other etiologies.   Start zyrtec 10mg  daily and monitor symptoms. May increase up to twice a day if needed.  Continue to avoid foods that bother her such as that red sausage and lo mein noodles.  Keep a food journal and see if she can notice a correlation between foods and symptoms.  For mild symptoms she can take over the counter antihistamines such as Benadryl and monitor symptoms closely. If symptoms worsen or if she has severe symptoms including breathing issues, throat closure, significant swelling, whole body hives, severe diarrhea and vomiting, lightheadedness then seek immediate medical care.  Discussed proper skin care measures.

## 2018-05-31 ENCOUNTER — Ambulatory Visit: Payer: Self-pay | Admitting: Allergy

## 2019-04-08 ENCOUNTER — Emergency Department (HOSPITAL_COMMUNITY)
Admission: EM | Admit: 2019-04-08 | Discharge: 2019-04-08 | Disposition: A | Discharge status: Home / Self Care | Payer: Self-pay | Attending: Emergency Medicine | Admitting: Emergency Medicine

## 2019-04-08 ENCOUNTER — Other Ambulatory Visit: Payer: Self-pay

## 2019-04-08 ENCOUNTER — Encounter (HOSPITAL_COMMUNITY): Payer: Self-pay | Admitting: Emergency Medicine

## 2019-04-08 DIAGNOSIS — Z79899 Other long term (current) drug therapy: Secondary | ICD-10-CM | POA: Insufficient documentation

## 2019-04-08 DIAGNOSIS — I10 Essential (primary) hypertension: Secondary | ICD-10-CM | POA: Insufficient documentation

## 2019-04-08 DIAGNOSIS — K0889 Other specified disorders of teeth and supporting structures: Secondary | ICD-10-CM | POA: Insufficient documentation

## 2019-04-08 DIAGNOSIS — F17210 Nicotine dependence, cigarettes, uncomplicated: Secondary | ICD-10-CM | POA: Insufficient documentation

## 2019-04-08 MED ORDER — KETOROLAC TROMETHAMINE 15 MG/ML IJ SOLN
15.0000 mg | Freq: Once | INTRAMUSCULAR | Status: AC
  Administered 2019-04-08: 10:00:00 15 mg via INTRAMUSCULAR
  Filled 2019-04-08: qty 1

## 2019-04-08 MED ORDER — OXYCODONE HCL 5 MG PO TABS
5.0000 mg | ORAL_TABLET | Freq: Once | ORAL | Status: AC
Start: 2019-04-08 — End: 2019-04-08
  Administered 2019-04-08: 5 mg via ORAL
  Filled 2019-04-08: qty 1

## 2019-04-08 MED ORDER — CLINDAMYCIN HCL 150 MG PO CAPS: 150.0000 mg | ORAL_CAPSULE | Freq: Four times a day (QID) | ORAL | 0 refills | Status: DC

## 2019-04-08 MED ORDER — CLINDAMYCIN HCL 300 MG PO CAPS
450.0000 mg | ORAL_CAPSULE | Freq: Once | ORAL | Status: AC
  Administered 2019-04-08: 10:00:00 450 mg via ORAL
  Filled 2019-04-08: qty 1

## 2019-04-08 MED ORDER — DIAZEPAM 5 MG PO TABS
5.0000 mg | ORAL_TABLET | Freq: Once | ORAL | Status: AC
  Administered 2019-04-08: 10:00:00 5 mg via ORAL
  Filled 2019-04-08: qty 1

## 2019-04-08 MED ORDER — ACETAMINOPHEN 500 MG PO TABS
1000.0000 mg | ORAL_TABLET | Freq: Once | ORAL | Status: AC
  Administered 2019-04-08: 1000 mg via ORAL
  Filled 2019-04-08: qty 2

## 2019-04-08 NOTE — ED Triage Notes (Addendum)
Pt complaint of upper left dental pain since yesterday. Pt adds has hx of hypertension but does not take medication for it "because it always makes something else go wrong."

## 2019-04-08 NOTE — ED Notes (Signed)
ED Provider at bedside. 

## 2019-04-08 NOTE — Discharge Instructions (Addendum)
Take 4 over the counter ibuprofen tablets 3 times a day or 2 over-the-counter naproxen tablets twice a day for pain. Also take tylenol 1000mg (2 extra strength) four times a day.   Return for fever, inability to swallow.

## 2019-04-08 NOTE — ED Provider Notes (Signed)
[facility] DEPT Provider Note   CSN: [id number] Arrival date & time: 04/08/19  0932     History Chief Complaint  Patient presents with  . Dental Pain  . Hypertension    Martha Garza is a 44 y.o. female.  44 yo F with a chief complaint of left upper dental pain.  Is been going on for couple days.  No fevers or chills feels like her left cheek is swollen.  No difficulty swallowing.  Has a history of poor dentition.  Has not seen a dentist in some time.  The history is provided by the patient.  Dental Pain Location:  Upper Upper teeth location:  14/LU 1st molar, 15/LU 2nd molar and 16/LU 3rd molar Quality:  Aching and pulsating Severity:  Moderate Onset quality:  Gradual Duration:  2 days Timing:  Constant Progression:  Worsening Chronicity:  New Context: dental fracture   Relieved by: cold cheek compress. Exacerbated by: work. Ineffective treatments:  None tried Associated symptoms: facial swelling and headaches   Associated symptoms: no congestion and no fever   Hypertension Associated symptoms include headaches. Pertinent negatives include no chest pain and no shortness of breath.       Past Medical History:  Diagnosis Date  . Bronchitis   . Hypertension     Patient Active Problem List   Diagnosis Date Noted  . Moderate persistent asthma without complication [id number]  . Rash and other nonspecific skin eruption 03/29/2018  . Tobacco user 03/29/2018  . Pruritus 03/29/2018  . Adverse food reaction 03/29/2018  . Drug reaction 03/29/2018    History reviewed. No pertinent surgical history.   OB History   No obstetric history on file.     Family History  Problem Relation Age of Onset  . Cancer Mother   . Cancer Father   . Cancer Niece     Social History   Tobacco Use  . Smoking status: Current Every Day Smoker    Packs/day: 1.00    Years: 23.00    Pack years: 23.00    Types: Cigarettes  . Smokeless tobacco:  Never Used  Substance Use Topics  . Alcohol use: Never  . Drug use: Never    Home Medications Prior to Admission medications   Medication Sig Start Date End Date Taking? Authorizing Provider  albuterol (PROVENTIL HFA;VENTOLIN HFA) 108 (90 Base) MCG/ACT inhaler INHALE 2 TO 4 PUFFS INTO THE LUNGS EVERY 4 TO 6 HOURS AS NEEDED FOR COUGH WHEEZE OR CHEST TIGHTNESS 01/27/18   [provider]  clindamycin (CLEOCIN) 150 MG capsule Take 1 capsule (150 mg total) by mouth every 6 (six) hours. 04/08/19   Deno Etienne, DO  Multiple Vitamins-Minerals (HAIR SKIN AND NAILS FORMULA) TABS Take by mouth.    [provider]    Allergies    Elimite [permethrin], Losartan, and Penicillins  Review of Systems   Review of Systems  Constitutional: Negative for chills and fever.  HENT: Positive for dental problem and facial swelling. Negative for congestion and rhinorrhea.   Eyes: Negative for redness and visual disturbance.  Respiratory: Negative for shortness of breath and wheezing.   Cardiovascular: Negative for chest pain and palpitations.  Gastrointestinal: Negative for nausea and vomiting.  Genitourinary: Negative for dysuria and urgency.  Musculoskeletal: Negative for arthralgias and myalgias.  Skin: Negative for pallor and wound.  Neurological: Positive for headaches. Negative for dizziness.    Physical Exam Updated Vital Signs BP (!) 194/86   Pulse 78   Temp  98.3 F (36.8 C) (Oral)   Resp 16   Ht 5\' 5"  (1.651 m)   Wt 97.1 kg   LMP 04/01/2019   SpO2 100%   BMI 35.61 kg/m   Physical Exam Vitals and nursing note reviewed.  Constitutional:      General: She is not in acute distress.    Appearance: She is well-developed. She is not diaphoretic.  HENT:     Head: Normocephalic and atraumatic.     Mouth/Throat:     Comments: Diffuse dental fractures.  Poor dentition.  No obvious edema tolerate secretions without difficulty no posterior oropharyngeal erythema or edema.  Able to  rotate her head 45 degrees in either direction without symptoms. Eyes:     Pupils: Pupils are equal, round, and reactive to light.  Cardiovascular:     Rate and Rhythm: Normal rate and regular rhythm.     Heart sounds: No murmur. No friction rub. No gallop.   Pulmonary:     Effort: Pulmonary effort is normal.     Breath sounds: No wheezing or rales.  Abdominal:     General: There is no distension.     Palpations: Abdomen is soft.     Tenderness: There is no abdominal tenderness.  Musculoskeletal:        General: No tenderness.     Cervical back: Normal range of motion and neck supple.  Skin:    General: Skin is warm and dry.  Neurological:     Mental Status: She is alert and oriented to person, place, and time.  Psychiatric:        Behavior: Behavior normal.     ED Results / Procedures / Treatments   Labs (all labs ordered are listed, but only abnormal results are displayed) Labs Reviewed - No data to display  EKG None  Radiology No results found.  Procedures Procedures (including critical care time)  Medications Ordered in ED Medications  acetaminophen (TYLENOL) tablet 1,000 mg (has no administration in time range)  ketorolac (TORADOL) 15 MG/ML injection 15 mg (has no administration in time range)  oxyCODONE (Oxy IR/ROXICODONE) immediate release tablet 5 mg (has no administration in time range)  diazepam (VALIUM) tablet 5 mg (has no administration in time range)  clindamycin (CLEOCIN) capsule 450 mg (has no administration in time range)    ED Course  I have reviewed the triage vital signs and the nursing notes.  Pertinent labs & imaging results that were available during my care of the patient were reviewed by me and considered in my medical decision making (see chart for details).    MDM Rules/Calculators/A&P                      44 yo F with a chief complaint of left upper dental pain.  Going on since yesterday.  Patient with diffuse poor dentition no  obvious focal source.  Will treat with antibiotics for possible infection and have her follow-up with a dentist in the office.  Of note the patient was significantly hypertensive.  Discussed this with her discussed she should follow-up with her family doctor for this.  10:14 AM:  I have discussed the diagnosis/risks/treatment options with the patient and believe the pt to be eligible for discharge home to follow-up with PCP, Dentist. We also discussed returning to the ED immediately if new or worsening sx occur. We discussed the sx which are most concerning (e.g., sudden worsening pain, fever, inability to tolerate by mouth) that  necessitate immediate return. Medications administered to the patient during their visit and any new prescriptions provided to the patient are listed below.  Medications given during this visit Medications  acetaminophen (TYLENOL) tablet 1,000 mg (has no administration in time range)  ketorolac (TORADOL) 15 MG/ML injection 15 mg (has no administration in time range)  oxyCODONE (Oxy IR/ROXICODONE) immediate release tablet 5 mg (has no administration in time range)  diazepam (VALIUM) tablet 5 mg (has no administration in time range)  clindamycin (CLEOCIN) capsule 450 mg (has no administration in time range)     The patient appears reasonably screen and/or stabilized for discharge and I doubt any other medical condition or other [facility] requiring further screening, evaluation, or treatment in the ED at this time prior to discharge.   Final Clinical Impression(s) / ED Diagnoses Final diagnoses:  Pain, dental    Rx / DC Orders ED Discharge Orders         Ordered    clindamycin (CLEOCIN) 150 MG capsule  Every 6 hours     04/08/19 1009           Deno Etienne, DO 04/08/19 1014

## 2019-05-15 ENCOUNTER — Encounter (HOSPITAL_COMMUNITY): Payer: Self-pay

## 2019-05-15 ENCOUNTER — Other Ambulatory Visit: Payer: Self-pay

## 2019-05-15 ENCOUNTER — Observation Stay (HOSPITAL_COMMUNITY)
Admission: EM | Admit: 2019-05-15 | Discharge: 2019-05-16 | Disposition: A | Discharge status: Home / Self Care | Payer: Self-pay | Attending: Internal Medicine | Admitting: Internal Medicine

## 2019-05-15 ENCOUNTER — Emergency Department (HOSPITAL_COMMUNITY): Payer: Self-pay

## 2019-05-15 DIAGNOSIS — Z8249 Family history of ischemic heart disease and other diseases of the circulatory system: Secondary | ICD-10-CM | POA: Insufficient documentation

## 2019-05-15 DIAGNOSIS — Z791 Long term (current) use of non-steroidal anti-inflammatories (NSAID): Secondary | ICD-10-CM | POA: Insufficient documentation

## 2019-05-15 DIAGNOSIS — R110 Nausea: Secondary | ICD-10-CM | POA: Insufficient documentation

## 2019-05-15 DIAGNOSIS — D649 Anemia, unspecified: Secondary | ICD-10-CM

## 2019-05-15 DIAGNOSIS — Z79899 Other long term (current) drug therapy: Secondary | ICD-10-CM | POA: Insufficient documentation

## 2019-05-15 DIAGNOSIS — D509 Iron deficiency anemia, unspecified: Secondary | ICD-10-CM | POA: Diagnosis present

## 2019-05-15 DIAGNOSIS — Z720 Tobacco use: Secondary | ICD-10-CM

## 2019-05-15 DIAGNOSIS — Z880 Allergy status to penicillin: Secondary | ICD-10-CM | POA: Insufficient documentation

## 2019-05-15 DIAGNOSIS — G9389 Other specified disorders of brain: Secondary | ICD-10-CM | POA: Insufficient documentation

## 2019-05-15 DIAGNOSIS — J4540 Moderate persistent asthma, uncomplicated: Secondary | ICD-10-CM | POA: Insufficient documentation

## 2019-05-15 DIAGNOSIS — D500 Iron deficiency anemia secondary to blood loss (chronic): Principal | ICD-10-CM | POA: Insufficient documentation

## 2019-05-15 DIAGNOSIS — Z20822 Contact with and (suspected) exposure to covid-19: Secondary | ICD-10-CM | POA: Insufficient documentation

## 2019-05-15 DIAGNOSIS — N920 Excessive and frequent menstruation with regular cycle: Secondary | ICD-10-CM | POA: Insufficient documentation

## 2019-05-15 DIAGNOSIS — I10 Essential (primary) hypertension: Secondary | ICD-10-CM | POA: Insufficient documentation

## 2019-05-15 DIAGNOSIS — Z888 Allergy status to other drugs, medicaments and biological substances status: Secondary | ICD-10-CM | POA: Insufficient documentation

## 2019-05-15 DIAGNOSIS — J42 Unspecified chronic bronchitis: Secondary | ICD-10-CM | POA: Insufficient documentation

## 2019-05-15 DIAGNOSIS — R519 Headache, unspecified: Secondary | ICD-10-CM | POA: Insufficient documentation

## 2019-05-15 DIAGNOSIS — E042 Nontoxic multinodular goiter: Secondary | ICD-10-CM | POA: Insufficient documentation

## 2019-05-15 DIAGNOSIS — F17210 Nicotine dependence, cigarettes, uncomplicated: Secondary | ICD-10-CM | POA: Insufficient documentation

## 2019-05-15 DIAGNOSIS — Z809 Family history of malignant neoplasm, unspecified: Secondary | ICD-10-CM | POA: Insufficient documentation

## 2019-05-15 LAB — COMPREHENSIVE METABOLIC PANEL
ALT: 13 U/L (ref 0–44)
AST: 12 U/L — ABNORMAL LOW (ref 15–41)
Albumin: 3.9 g/dL (ref 3.5–5.0)
Alkaline Phosphatase: 50 U/L (ref 38–126)
Anion gap: 6 (ref 5–15)
BUN: 10 mg/dL (ref 6–20)
CO2: 25 mmol/L (ref 22–32)
Calcium: 8.8 mg/dL — ABNORMAL LOW (ref 8.9–10.3)
Chloride: 111 mmol/L (ref 98–111)
Creatinine, Ser: 0.56 mg/dL (ref 0.44–1.00)
GFR calc Af Amer: >60 mL/min (ref >60)
GFR calc non Af Amer: >60 mL/min (ref >60)
Glucose, Bld: 92 mg/dL (ref 70–99)
Potassium: 4.2 mmol/L (ref 3.5–5.1)
Sodium: 142 mmol/L (ref 135–145)
Total Bilirubin: 0.6 mg/dL (ref 0.3–1.2)
Total Protein: 6.8 g/dL (ref 6.5–8.1)

## 2019-05-15 LAB — URINALYSIS, ROUTINE W REFLEX MICROSCOPIC
Bacteria, UA: NONE SEEN
Bilirubin Urine: NEGATIVE
Glucose, UA: NEGATIVE mg/dL
Hgb urine dipstick: NEGATIVE
Ketones, ur: NEGATIVE mg/dL
Leukocytes,Ua: NEGATIVE
Nitrite: NEGATIVE
Protein, ur: 30 mg/dL — AB
Specific Gravity, Urine: 1.02 (ref 1.005–1.030)
pH: 7 (ref 5.0–8.0)

## 2019-05-15 LAB — CBC
HCT: 25.4 % — ABNORMAL LOW (ref 36.0–46.0)
Hemoglobin: 6.3 g/dL — CL (ref 12.0–15.0)
MCH: 15.7 pg — ABNORMAL LOW (ref 26.0–34.0)
MCHC: 24.8 g/dL — ABNORMAL LOW (ref 30.0–36.0)
MCV: 63.3 fL — ABNORMAL LOW (ref 80.0–100.0)
Platelets: 191 10*3/uL (ref 150–400)
RBC: 4.01 MIL/uL (ref 3.87–5.11)
RDW: 21.3 % — ABNORMAL HIGH (ref 11.5–15.5)
WBC: 6.4 10*3/uL (ref 4.0–10.5)
nRBC: 0 % (ref 0.0–0.2)

## 2019-05-15 LAB — IRON AND TIBC
Iron: 13 ug/dL — ABNORMAL LOW (ref 28–170)
Saturation Ratios: 2 % — ABNORMAL LOW (ref 10.4–31.8)
TIBC: 560 ug/dL — ABNORMAL HIGH (ref 250–450)
UIBC: 547 ug/dL

## 2019-05-15 LAB — PREPARE RBC (CROSSMATCH)

## 2019-05-15 LAB — RETICULOCYTES
Immature Retic Fract: 20.6 % — ABNORMAL HIGH (ref 2.3–15.9)
RBC.: 3.98 MIL/uL (ref 3.87–5.11)
Retic Count, Absolute: 53.7 10*3/uL (ref 19.0–186.0)
Retic Ct Pct: 1.4 % (ref 0.4–3.1)

## 2019-05-15 LAB — POC OCCULT BLOOD, ED: Fecal Occult Bld: POSITIVE — AB

## 2019-05-15 LAB — I-STAT BETA HCG BLOOD, ED (MC, WL, AP ONLY): I-stat hCG, quantitative: <5 m[IU]/mL (ref <5)

## 2019-05-15 LAB — LIPASE, BLOOD: Lipase: 35 U/L (ref 11–51)

## 2019-05-15 LAB — ABO/RH: ABO/RH(D): O POS

## 2019-05-15 LAB — VITAMIN B12: Vitamin B-12: 334 pg/mL (ref 180–914)

## 2019-05-15 LAB — FOLATE: Folate: 5.6 ng/mL — ABNORMAL LOW (ref >5.9)

## 2019-05-15 LAB — FERRITIN: Ferritin: 2 ng/mL — ABNORMAL LOW (ref 11–307)

## 2019-05-15 MED ORDER — SODIUM CHLORIDE 0.9% FLUSH: 3.0000 mL | Freq: Once | INTRAVENOUS | Status: DC

## 2019-05-15 MED ORDER — ONDANSETRON HCL 4 MG PO TABS: 4.0000 mg | ORAL_TABLET | Freq: Four times a day (QID) | ORAL | Status: DC | PRN

## 2019-05-15 MED ORDER — SODIUM CHLORIDE (PF) 0.9 % IJ SOLN
INTRAMUSCULAR | Status: AC
  Filled 2019-05-15: qty 50

## 2019-05-15 MED ORDER — GUAIFENESIN ER 600 MG PO TB12
600.0000 mg | ORAL_TABLET | Freq: Two times a day (BID) | ORAL | Status: DC
  Administered 2019-05-16: 10:00:00 600 mg via ORAL
  Filled 2019-05-15 (×2): qty 1

## 2019-05-15 MED ORDER — ACETAMINOPHEN 325 MG PO TABS
650.0000 mg | ORAL_TABLET | Freq: Four times a day (QID) | ORAL | Status: DC | PRN
  Administered 2019-05-15 – 2019-05-16 (×2): 650 mg via ORAL
  Filled 2019-05-15: qty 2

## 2019-05-15 MED ORDER — SODIUM CHLORIDE 0.9 % IV SOLN: 10.0000 mL/h | Freq: Once | INTRAVENOUS | Status: DC

## 2019-05-15 MED ORDER — ONDANSETRON HCL 4 MG/2ML IJ SOLN: 4.0000 mg | Freq: Four times a day (QID) | INTRAMUSCULAR | Status: DC | PRN

## 2019-05-15 MED ORDER — BISACODYL 10 MG RE SUPP: 10.0000 mg | Freq: Every day | RECTAL | Status: DC | PRN

## 2019-05-15 MED ORDER — HYDRALAZINE HCL 10 MG PO TABS
10.0000 mg | ORAL_TABLET | Freq: Four times a day (QID) | ORAL | Status: DC | PRN
  Filled 2019-05-15: qty 1

## 2019-05-15 MED ORDER — HYDROCHLOROTHIAZIDE 25 MG PO TABS
25.0000 mg | ORAL_TABLET | Freq: Every day | ORAL | Status: DC
  Administered 2019-05-15 – 2019-05-16 (×2): 25 mg via ORAL
  Filled 2019-05-15 (×2): qty 1

## 2019-05-15 MED ORDER — DOCUSATE SODIUM 100 MG PO CAPS
100.0000 mg | ORAL_CAPSULE | Freq: Two times a day (BID) | ORAL | Status: DC
  Administered 2019-05-16 (×2): 100 mg via ORAL
  Filled 2019-05-15 (×2): qty 1

## 2019-05-15 MED ORDER — IOHEXOL 350 MG/ML SOLN
100.0000 mL | Freq: Once | INTRAVENOUS | Status: AC | PRN
  Administered 2019-05-15: 17:00:00 100 mL via INTRAVENOUS

## 2019-05-15 MED ORDER — CARVEDILOL 6.25 MG PO TABS
6.2500 mg | ORAL_TABLET | Freq: Two times a day (BID) | ORAL | Status: DC
  Administered 2019-05-15: 20:00:00 6.25 mg via ORAL
  Filled 2019-05-15 (×2): qty 1

## 2019-05-15 MED ORDER — ALBUTEROL SULFATE (2.5 MG/3ML) 0.083% IN NEBU: 3.0000 mL | INHALATION_SOLUTION | RESPIRATORY_TRACT | Status: DC | PRN

## 2019-05-15 MED ORDER — NICOTINE 14 MG/24HR TD PT24
14.0000 mg | MEDICATED_PATCH | Freq: Every day | TRANSDERMAL | Status: DC
  Administered 2019-05-15 – 2019-05-16 (×2): 14 mg via TRANSDERMAL
  Filled 2019-05-15 (×2): qty 1

## 2019-05-15 MED ORDER — POLYETHYLENE GLYCOL 3350 17 G PO PACK: 17.0000 g | PACK | Freq: Every day | ORAL | Status: DC | PRN

## 2019-05-15 NOTE — ED Notes (Signed)
ED TO INPATIENT HANDOFF REPORT  Name/Age/Gender Martha Garza 44 y.o. female  Code Status   Home/SNF/Other Home  Chief Complaint Anemia [D64.9]  Level of Care/Admitting Diagnosis ED Disposition    ED Disposition Condition Comment   Admit  Hospital Area: [facility] [id number]  Level of Care: Telemetry [5]  Admit to tele based on following criteria: Complex arrhythmia (Bradycardia/Tachycardia)  Admit to tele based on following criteria: Monitor QTC interval  Covid Evaluation: Asymptomatic Screening Protocol (No Symptoms)  Diagnosis: Anemia [id number]  Admitting Physician: Flora Lipps [id number]  Attending Physician: Flora Lipps [id number]       Medical History Past Medical History:  Diagnosis Date  . Bronchitis   . Hypertension     Allergies Allergies  Allergen Reactions  . Elimite [Permethrin] Hives  . Losartan Hives  . Penicillins Hives    IV Location/Drains/Wounds Patient Lines/Drains/Airways Status   Active Line/Drains/Airways    Name:   Placement date:   Placement time:   Site:   Days:   Peripheral IV 05/15/19 Left Antecubital   05/15/19    1618    Antecubital   less than 1   Peripheral IV 05/15/19 Right Antecubital   05/15/19    1618    Antecubital   less than 1          Labs/Imaging Results for orders placed or performed during the hospital encounter of 05/15/19 (from the past 48 hour(s))  Reticulocytes     Status: Abnormal   Collection Time: 05/15/19  2:13 PM  Result Value Ref Range   Retic Ct Pct 1.4 0.4 - 3.1 %   RBC. 3.98 3.87 - 5.11 MIL/uL   Retic Count, Absolute 53.7 19.0 - 186.0 K/uL   Immature Retic Fract 20.6 (H) 2.3 - 15.9 %    Comment: Performed at [facility], [place] [address], [place], [place] [address]  Lipase, blood     Status: None   Collection Time: 05/15/19  2:15 PM  Result Value Ref Range   Lipase 35 11 - 51 U/L    Comment: Performed at [facility], [place]  [address] [place], [place] [address]  Comprehensive metabolic panel     Status: Abnormal   Collection Time: 05/15/19  2:15 PM  Result Value Ref Range   Sodium 142 135 - 145 mmol/L   Potassium 4.2 3.5 - 5.1 mmol/L   Chloride 111 98 - 111 mmol/L   CO2 25 22 - 32 mmol/L   Glucose, Bld 92 70 - 99 mg/dL    Comment: Glucose reference range applies only to samples taken after fasting for at least 8 hours.   BUN 10 6 - 20 mg/dL   Creatinine, Ser 0.56 0.44 - 1.00 mg/dL   Calcium 8.8 (L) 8.9 - 10.3 mg/dL   Total Protein 6.8 6.5 - 8.1 g/dL   Albumin 3.9 3.5 - 5.0 g/dL   AST 12 (L) 15 - 41 U/L   ALT 13 0 - 44 U/L   Alkaline Phosphatase 50 38 - 126 U/L   Total Bilirubin 0.6 0.3 - 1.2 mg/dL   GFR calc non Af Amer >60 >60 mL/min   GFR calc Af Amer >60 >60 mL/min   Anion gap 6 5 - 15    Comment: Performed at [facility], [place] [address] [place], [place] [address]  CBC     Status: Abnormal   Collection Time: 05/15/19  2:15 PM  Result Value Ref Range  WBC 6.4 4.0 - 10.5 K/uL   RBC 4.01 3.87 - 5.11 MIL/uL   Hemoglobin 6.3 (LL) 12.0 - 15.0 g/dL    Comment: CRITICAL RESULT CALLED TO, READ BACK BY AND VERIFIED WITH: MCIVER M RN M BLACK 14:45 05/15/19    HCT 25.4 (L) 36.0 - 46.0 %   MCV 63.3 (L) 80.0 - 100.0 fL   MCH 15.7 (L) 26.0 - 34.0 pg   MCHC 24.8 (L) 30.0 - 36.0 g/dL   RDW 21.3 (H) 11.5 - 15.5 %   Platelets 191 150 - 400 K/uL   nRBC 0.0 0.0 - 0.2 %    Comment: Performed at [facility], [place] [address], [place], [place] [address]  I-Stat beta hCG blood, ED     Status: None   Collection Time: 05/15/19  2:21 PM  Result Value Ref Range   I-stat hCG, quantitative <5.0 <5 mIU/mL   Comment 3            Comment:   GEST. AGE      CONC.  (mIU/mL)   <=1 WEEK        5 - 50     2 WEEKS       50 - 500     3 WEEKS       100 - 10,000     4 WEEKS     1,000 - 30,000        FEMALE AND NON-PREGNANT FEMALE:     LESS THAN 5 mIU/mL   Urinalysis, Routine w reflex  microscopic     Status: Abnormal   Collection Time: 05/15/19  2:27 PM  Result Value Ref Range   Color, Urine YELLOW YELLOW   APPearance CLEAR CLEAR   Specific Gravity, Urine 1.020 1.005 - 1.030   pH 7.0 5.0 - 8.0   Glucose, UA NEGATIVE NEGATIVE mg/dL   Hgb urine dipstick NEGATIVE NEGATIVE   Bilirubin Urine NEGATIVE NEGATIVE   Ketones, ur NEGATIVE NEGATIVE mg/dL   Protein, ur 30 (A) NEGATIVE mg/dL   Nitrite NEGATIVE NEGATIVE   Leukocytes,Ua NEGATIVE NEGATIVE   RBC / HPF 0-5 0 - 5 RBC/hpf   WBC, UA 0-5 0 - 5 WBC/hpf   Bacteria, UA NONE SEEN NONE SEEN   Squamous Epithelial / LPF 0-5 0 - 5   Mucus PRESENT     Comment: Performed at [facility], [place] [address] [place], [place] [address]  POC occult blood, ED RN will collect     Status: Abnormal   Collection Time: 05/15/19  4:32 PM  Result Value Ref Range   Fecal Occult Bld POSITIVE (A) NEGATIVE  Vitamin B12     Status: None   Collection Time: 05/15/19  4:40 PM  Result Value Ref Range   Vitamin B-12 334 180 - 914 pg/mL    Comment: (NOTE) This assay is not validated for testing neonatal or myeloproliferative syndrome specimens for Vitamin B12 levels. Performed at [facility], [place] [address], [place], [place] [address]   Folate     Status: Abnormal   Collection Time: 05/15/19  4:40 PM  Result Value Ref Range   Folate 5.6 (L) >5.9 ng/mL    Comment: Performed at [facility], [place] [address], [place], [place] [address]  Iron and TIBC     Status: Abnormal   Collection Time: 05/15/19  4:40 PM  Result Value Ref Range   Iron 13 (L) 28 - 170 ug/dL   TIBC 560 (  H) 250 - 450 ug/dL   Saturation Ratios 2 (L) 10.4 - 31.8 %   UIBC 547 ug/dL    Comment: Performed at [facility], [place] [address], [place], [place] [address]  Ferritin     Status: Abnormal   Collection Time: 05/15/19  4:40 PM  Result Value Ref Range   Ferritin 2 (L) 11 - 307 ng/mL    Comment:  Performed at [facility], [place] [address], [place], [place] [address]  Prepare RBC (crossmatch)     Status: None   Collection Time: 05/15/19  4:40 PM  Result Value Ref Range   Order Confirmation      ORDER PROCESSED BY BLOOD BANK Performed at [place] [address], [place], [place] [address]   Type and screen [facility]     Status: None (Preliminary result)   Collection Time: 05/15/19  4:40 PM  Result Value Ref Range   ABO/RH(D) O POS    Antibody Screen NEG    Sample Expiration 05/18/2019,2359    Unit Number [id number]    Blood Component Type RED CELLS,LR    Unit division 00    Status of Unit ISSUED    Transfusion Status OK TO TRANSFUSE    Crossmatch Result      Compatible Performed at [facility], [place] [address] [place], [place] [address]    Unit Number [id number]    Blood Component Type RED CELLS,LR    Unit division 00    Status of Unit ALLOCATED    Transfusion Status OK TO TRANSFUSE    Crossmatch Result Compatible   ABO/Rh     Status: None   Collection Time: 05/15/19  4:40 PM  Result Value Ref Range   ABO/RH(D)      Jenetta Downer POS Performed at [facility], [place] [address], [place], [place] [address]    CT Angio Head W or Wo Contrast  Result Date: 05/15/2019 CLINICAL DATA:  Headache, family history of aneurysm EXAM: CT ANGIOGRAPHY HEAD AND NECK TECHNIQUE: Multidetector CT imaging of the head and neck was performed using the standard protocol during bolus administration of intravenous contrast. Multiplanar CT image reconstructions and MIPs were obtained to evaluate the vascular anatomy. Carotid stenosis measurements (when applicable) are obtained utilizing NASCET criteria, using the distal internal carotid diameter as the denominator. CONTRAST:  161mL OMNIPAQUE IOHEXOL 350 MG/ML SOLN COMPARISON:  None. FINDINGS: CT HEAD Brain: There is no acute intracranial hemorrhage, mass  effect, or edema. Gray-white differentiation is preserved. Left cerebellar encephalomalacia probably reflecting prior infarct. There is no extra-axial fluid collection. Ventricles and sulci are within normal limits in size and configuration. Partially empty sella. Vascular: No hyperdense vessel or unexpected calcification. Skull: Calvarium is unremarkable. Sinuses/Orbits: Patchy mucosal thickening.  Orbits are unremarkable. Other: None. Review of the MIP images confirms the above findings CTA NECK Aortic arch: Great vessel origins are patent. Right carotid system: Patent.  No measurable stenosis. Left carotid system: Patent.  No measurable stenosis. Vertebral arteries: Patent and codominant.  No measurable stenosis Skeleton: Unremarkable. Other neck: No mass or adenopathy. Subcentimeter partially calcified thyroid nodules. No further follow-up is recommended per current guidelines. Upper chest: No apical lung mass. Review of the MIP images confirms the above findings CTA HEAD Anterior circulation: Intracranial internal carotid arteries are patent. Anterior and middle cerebral arteries are patent. An anterior communicating artery is present. Posterior circulation: Intracranial vertebral arteries, basilar artery, and posterior cerebral arteries are  patent. A right posterior communicating artery is present. Venous sinuses: Patent as allowed by contrast bolus timing. Review of the MIP images confirms the above findings IMPRESSION: No acute intracranial abnormality. 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 No large vessel occlusion, hemodynamically significant stenosis, or evidence of dissection. No aneurysm. Electronically Signed   By: Macy Mis M.D.   On: 05/15/2019 17:48   CT Angio Neck W and/or Wo Contrast  Result Date: 05/15/2019 CLINICAL DATA:  Headache, family history of aneurysm EXAM: CT ANGIOGRAPHY HEAD AND NECK TECHNIQUE: Multidetector CT imaging of the head and neck was  performed using the standard protocol during bolus administration of intravenous contrast. Multiplanar CT image reconstructions and MIPs were obtained to evaluate the vascular anatomy. Carotid stenosis measurements (when applicable) are obtained utilizing NASCET criteria, using the distal internal carotid diameter as the denominator. CONTRAST:  170mL OMNIPAQUE IOHEXOL 350 MG/ML SOLN COMPARISON:  None. FINDINGS: CT HEAD Brain: There is no acute intracranial hemorrhage, mass effect, or edema. Gray-white differentiation is preserved. Left cerebellar encephalomalacia probably reflecting prior infarct. There is no extra-axial fluid collection. Ventricles and sulci are within normal limits in size and configuration. Partially empty sella. Vascular: No hyperdense vessel or unexpected calcification. Skull: Calvarium is unremarkable. Sinuses/Orbits: Patchy mucosal thickening.  Orbits are unremarkable. Other: None. Review of the MIP images confirms the above findings CTA NECK Aortic arch: Great vessel origins are patent. Right carotid system: Patent.  No measurable stenosis. Left carotid system: Patent.  No measurable stenosis. Vertebral arteries: Patent and codominant.  No measurable stenosis Skeleton: Unremarkable. Other neck: No mass or adenopathy. Subcentimeter partially calcified thyroid nodules. No further follow-up is recommended per current guidelines. Upper chest: No apical lung mass. Review of the MIP images confirms the above findings CTA HEAD Anterior circulation: Intracranial internal carotid arteries are patent. Anterior and middle cerebral arteries are patent. An anterior communicating artery is present. Posterior circulation: Intracranial vertebral arteries, basilar artery, and posterior cerebral arteries are patent. A right posterior communicating artery is present. Venous sinuses: Patent as allowed by contrast bolus timing. Review of the MIP images confirms the above findings IMPRESSION: No acute  intracranial abnormality. 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 No large vessel occlusion, hemodynamically significant stenosis, or evidence of dissection. No aneurysm. Electronically Signed   By: Macy Mis M.D.   On: 05/15/2019 17:48    Pending Labs Unresulted Labs (From admission, onward)    Start     Ordered   05/15/19 1618  SARS CORONAVIRUS 2 (TAT 6-24 HRS) Nasopharyngeal Nasopharyngeal Swab  (Tier 3 (TAT 6-24 hrs))  Once,   STAT    Question Answer Comment  Is this test for diagnosis or screening Screening   Symptomatic for COVID-19 as defined by CDC No   Hospitalized for COVID-19 No   Admitted to ICU for COVID-19 No   Previously tested for COVID-19 No   Resident in a congregate (group) care setting No   Employed in healthcare setting No   Pregnant No      05/15/19 1617   Signed and Held  HIV Antibody (routine testing w rflx)  (HIV Antibody (Routine testing w reflex) panel)  Once,   R     Signed and Held   Signed and Held  Brain natriuretic peptide  Once,   R     Signed and Held   Signed and Held  TSH  Once,   R     Signed and Held   Signed and [employer]  metabolic panel  Tomorrow morning,   R     Signed and Held   Signed and Held  CBC  Tomorrow morning,   R     Signed and Held          Vitals/Pain Today's Vitals   05/15/19 1933 05/15/19 1951 05/15/19 2012 05/15/19 2030  BP: (!) 170/107 (!) 201/95 (!) 203/90 (!) 194/90  Pulse: 69 70 71 70  Resp: 17 19 18 17   Temp: 98.4 F (36.9 C) 97.9 F (36.6 C)    TempSrc: Oral Oral    SpO2: 100% 100% 100% 100%  Weight:      Height:      PainSc:        Isolation Precautions No active isolations  Medications Medications  0.9 %  sodium chloride infusion (has no administration in time range)  sodium chloride (PF) 0.9 % injection (has no administration in time range)  hydrochlorothiazide (HYDRODIURIL) tablet 25 mg (25 mg Oral Given 05/15/19 1941)  carvedilol (COREG) tablet 6.25 mg (6.25  mg Oral Given 05/15/19 1941)  hydrALAZINE (APRESOLINE) tablet 10 mg (has no administration in time range)  acetaminophen (TYLENOL) tablet 650 mg (has no administration in time range)  nicotine (NICODERM CQ - dosed in mg/24 hours) patch 14 mg (14 mg Transdermal Patch Applied 05/15/19 1941)  iohexol (OMNIPAQUE) 350 MG/ML injection 100 mL (100 mLs Intravenous Contrast Given 05/15/19 1645)    Mobility walks

## 2019-05-15 NOTE — ED Notes (Signed)
Date and time results received: 05/15/19 2:46 PM  (use smartphrase ".now" to insert current time)  Test: Hgb Critical Value: 6.3  Name of Provider Notified: Nanavati  Orders Received? Or Actions Taken?: Actions Taken: Patient will be roomed stat

## 2019-05-15 NOTE — ED Triage Notes (Signed)
Patient states she has been having intermittent headache and nausea x 1 week. Patient states worse yesterday. Patient states the nausea is always in the morning.

## 2019-05-15 NOTE — Progress Notes (Signed)
Report received from [facility] in the ED.  ED RN to address blood pressure with hydralazine and transport patient.

## 2019-05-15 NOTE — ED Provider Notes (Addendum)
[facility] DEPT Provider Note   CSN: [id number] Arrival date & time: 05/15/19  1021     History Chief Complaint  Patient presents with  . Headache  . Nausea    Martha Garza is a 44 y.o. female.  HPI     44 year old female comes in a chief complaint of headache. Patient reports that she has chronic headaches however over the last 2 weeks she has been having sharp headaches that are frontal.  The headaches are moderately severe.  She has associated nausea without vomiting, which is worse in the morning.  Patient has no associated new numbness, tingling, speech difficulty, weakness, vision change.  She is concerned about her headache because she has family history of brain aneurysm in her cousins.  Patient is a smoker and has elevated blood pressure that is not greatly controlled.  She is new to [place] and does not have a PCP here.  Her labs here show anemia.  Patient reports that she has had heavy vaginal bleeding all of her life.  She has never required blood transfusion.  Review of system is positive for dizziness with exertion, weakness.  Patient denies any history of blood clots in her legs or lungs.  She has no risk factors such as long distance travel, birth control or estrogen use, cancer. She also denies any bloody or tarry stools.  Past Medical History:  Diagnosis Date  . Bronchitis   . Hypertension     Patient Active Problem List   Diagnosis Date Noted  . Anemia 05/15/2019  . Moderate persistent asthma without complication [id number]  . Rash and other nonspecific skin eruption 03/29/2018  . Tobacco user 03/29/2018  . Pruritus 03/29/2018  . Adverse food reaction 03/29/2018  . Drug reaction 03/29/2018    History reviewed. No pertinent surgical history.   OB History   No obstetric history on file.     Family History  Problem Relation Age of Onset  . Cancer Mother   . Cancer Father   . Cancer Niece     Social History     Tobacco Use  . Smoking status: Current Every Day Smoker    Packs/day: 0.35    Years: 23.00    Pack years: 8.05    Types: Cigarettes  . Smokeless tobacco: Never Used  Substance Use Topics  . Alcohol use: Never  . Drug use: Never    Home Medications Prior to Admission medications   Medication Sig Start Date End Date Taking? Authorizing Provider  albuterol (PROVENTIL HFA;VENTOLIN HFA) 108 (90 Base) MCG/ACT inhaler Inhale 2-4 puffs into the lungs every 4 (four) hours as needed for wheezing or shortness of breath.  01/27/18  Yes [provider]  clindamycin (CLEOCIN) 150 MG capsule Take 1 capsule (150 mg total) by mouth every 6 (six) hours. Patient not taking: Reported on 05/15/2019 04/08/19   Deno Etienne, DO    Allergies    Elimite [permethrin], Losartan, and Penicillins  Review of Systems   Review of Systems  Constitutional: Positive for activity change.  Respiratory: Negative for shortness of breath.   Cardiovascular: Negative for chest pain.  Gastrointestinal: Positive for nausea.  Skin: Negative for rash.  Allergic/Immunologic: Negative for immunocompromised state.  Neurological: Positive for headaches.  Hematological: Does not bruise/bleed easily.  All other systems reviewed and are negative.   Physical Exam Updated Vital Signs BP (!) 180/86   Pulse 70   Temp 98.1 F (36.7 C) (Oral)   Resp 18  Ht 5\' 5"  (1.651 m)   Wt 97.1 kg   LMP 04/24/2019   SpO2 100%   BMI 35.61 kg/m   Physical Exam Vitals and nursing note reviewed.  Constitutional:      Appearance: She is well-developed.  HENT:     Head: Normocephalic and atraumatic.  Eyes:     General: No visual field deficit. Cardiovascular:     Rate and Rhythm: Normal rate.  Pulmonary:     Effort: Pulmonary effort is normal.  Abdominal:     General: Bowel sounds are normal.  Musculoskeletal:     Cervical back: Normal range of motion and neck supple.  Skin:    General: Skin is warm and dry.   Neurological:     Mental Status: She is alert and oriented to person, place, and time.     GCS: GCS eye subscore is 4. GCS verbal subscore is 5. GCS motor subscore is 6.     Cranial Nerves: No cranial nerve deficit, dysarthria or facial asymmetry.     Sensory: No sensory deficit.     Motor: No weakness.     ED Results / Procedures / Treatments   Labs (all labs ordered are listed, but only abnormal results are displayed) Labs Reviewed  COMPREHENSIVE METABOLIC PANEL - Abnormal; Notable for the following components:      Result Value   Calcium 8.8 (*)    AST 12 (*)    All other components within normal limits  CBC - Abnormal; Notable for the following components:   Hemoglobin 6.3 (*)    HCT 25.4 (*)    MCV 63.3 (*)    MCH 15.7 (*)    MCHC 24.8 (*)    RDW 21.3 (*)    All other components within normal limits  URINALYSIS, ROUTINE W REFLEX MICROSCOPIC - Abnormal; Notable for the following components:   Protein, ur 30 (*)    All other components within normal limits  RETICULOCYTES - Abnormal; Notable for the following components:   Immature Retic Fract 20.6 (*)    All other components within normal limits  POC OCCULT BLOOD, ED - Abnormal; Notable for the following components:   Fecal Occult Bld POSITIVE (*)    All other components within normal limits  SARS CORONAVIRUS 2 (TAT 6-24 HRS)  LIPASE, BLOOD  VITAMIN B12  FOLATE  IRON AND TIBC  FERRITIN  I-STAT BETA HCG BLOOD, ED ([facility], [facility], [facility] ONLY)  PREPARE RBC (CROSSMATCH)    EKG None  Radiology No results found.  Procedures .Critical Care Performed by: Varney Biles, MD Authorized by: Varney Biles, MD   Critical care provider statement:    Critical care time (minutes):  45   Critical care was necessary to treat or prevent imminent or life-threatening deterioration of the following conditions:  Circulatory failure   Critical care was time spent personally by me on the following activities:  Discussions with  consultants, evaluation of patient's response to treatment, examination of patient, ordering and performing treatments and interventions, ordering and review of laboratory studies, ordering and review of radiographic studies, pulse oximetry, re-evaluation of patient's condition, obtaining history from patient or surrogate and review of old charts   (including critical care time)  Medications Ordered in ED Medications  0.9 %  sodium chloride infusion (has no administration in time range)  sodium chloride (PF) 0.9 % injection (has no administration in time range)  iohexol (OMNIPAQUE) 350 MG/ML injection 100 mL (100 mLs Intravenous Contrast Given 05/15/19 1645)  ED Course  I have reviewed the triage vital signs and the nursing notes.  Pertinent labs & imaging results that were available during my care of the patient were reviewed by me and considered in my medical decision making (see chart for details).    MDM Rules/Calculators/A&P                      44 year old comes in a chief complaint of headaches and dizziness with nausea. Her neuro exam is completely nonfocal.  She has family history of brain aneurysm, which is in our differential and we will get CT angiogram.  She has no focal neurologic deficits and the headaches have not present for last several weeks now.  Pretest probability for subarachnoid hemorrhage is not very high, unless the CT angiogram reveals large aneurysm.  Additional concerns were for brain bleed, tumor, idiopathic intracranial hypertension, venous thrombosis.  Patient is noted to be anemic.  It appears that the anemia is chronic in nature and secondary to blood loss because of her heavy periods.  She has agreed to transfusion and admission to the hospital.  Final Clinical Impression(s) / ED Diagnoses Final diagnoses:  Symptomatic anemia    Rx / DC Orders ED Discharge Orders    None       Varney Biles, MD 05/15/19 [place], [place],  MD 05/15/19 1713

## 2019-05-15 NOTE — ED Notes (Signed)
Pt transported to CT ?

## 2019-05-15 NOTE — H&P (Signed)
[facility] History and Physical  Doriane Shreeves [id number] DOB: 1976-01-22 DOA: 05/15/2019  Referring physician: ED  PCP: Patient, No Pcp Per   Patient is coming from: Home    Chief Complaint: Headache and nausea  HPI: Martha Garza is a 44 y.o. female with past medical history of hypertension not on medication, chronic smoker, bronchitis, history of menorrhagia and vaginal bleeding presented to hospital with headache and nausea for the last 1 week or so.  Patient states that she has been having headache intermittently for a long time now but recently is getting worse with shooting sensation.  She also complains of nausea for the last 1 day but denies any vomiting.  Denies any chest pain, shortness of breath, dizziness, lightheadedness but has a generalized sensation of weakness.  Patient denies any urinary urgency, frequency or dysuria.  Denies any changes in her bowel habits.  No hematemesis or melena.  However endorses to having heavy menstrual period with blood clots but has not seen gynecologist recently.  She does not have a primary care physician at this time.  ED Course: In the ED, patient was noted to have high blood pressure and was severely anemic.  PRBC transfusion as arranged from the ED and patient was considered for admission to the hospital.  Patient did have hemoglobin of 6.3 on presentation.  Blood pressure was 180/ 86.   Review of Systems:  All systems were reviewed and were negative unless otherwise mentioned in the HPI  Past Medical History:  Diagnosis Date  . Bronchitis   . Hypertension    History reviewed. No pertinent surgical history.  Social History:  reports that she has been smoking cigarettes. She has a 10.50 pack-year smoking history. She has never used smokeless tobacco. She reports that she does not drink alcohol or use drugs.  Allergies  Allergen Reactions  . Elimite [Permethrin] Hives  . Losartan Hives  . Penicillins Hives    Family  History  Problem Relation Age of Onset  . Cancer Mother   . Cancer Father   . Cancer Niece      Prior to Admission medications   Medication Sig Start Date End Date Taking? Authorizing Provider  albuterol (PROVENTIL HFA;VENTOLIN HFA) 108 (90 Base) MCG/ACT inhaler Inhale 2-4 puffs into the lungs every 4 (four) hours as needed for wheezing or shortness of breath.  01/27/18  Yes [provider]  clindamycin (CLEOCIN) 150 MG capsule Take 1 capsule (150 mg total) by mouth every 6 (six) hours. Patient not taking: Reported on 05/15/2019 04/08/19   Deno Etienne, DO    Physical Exam: Vitals:   05/15/19 1052 05/15/19 1103 05/15/19 1415 05/15/19 1640  BP: (!) 171/88  (!) 179/101 (!) 180/86  Pulse: 83  72 70  Resp: 18  16 18   Temp: 98.1 F (36.7 C)     TempSrc: Oral     SpO2: 97%  100% 100%  Weight:  97.1 kg    Height:  5\' 5"  (1.651 m)     Wt Readings from Last 3 Encounters:  05/15/19 97.1 kg  04/08/19 97.1 kg  03/29/18 110.4 kg   Body mass index is 35.61 kg/m.  General: Morbidly obese, not in obvious distress HENT: Normocephalic, pupils equally reacting to light and accommodation.  Pallor noted.  Oral mucosa is moist.  Chest:  Clear breath sounds.  Diminished breath sounds bilaterally. No crackles or wheezes.  CVS: S1 &S2 heard. No murmur.  Regular rate and rhythm. Abdomen: Soft, nontender, nondistended.  Bowel sounds are heard.  Liver is not palpable, no abdominal mass palpated Extremities: No cyanosis, clubbing or edema.  Peripheral pulses are palpable. Psych: Alert, awake and oriented, normal mood CNS:  No cranial nerve deficits.  Power equal in all extremities. Skin: Warm and dry.  No rashes noted.  Labs on Admission:   CBC: Recent Labs  Lab 05/15/19 1415  WBC 6.4  HGB 6.3*  HCT 25.4*  MCV 63.3*  PLT [id number]    Basic Metabolic Panel: Recent Labs  Lab 05/15/19 1415  NA 142  K 4.2  CL 111  CO2 25  GLUCOSE 92  BUN 10  CREATININE 0.56  CALCIUM 8.8*    Liver  Function Tests: Recent Labs  Lab 05/15/19 1415  AST 12*  ALT 13  ALKPHOS 50  BILITOT 0.6  PROT 6.8  ALBUMIN 3.9   Recent Labs  Lab 05/15/19 1415  LIPASE 35   No results for input(s): AMMONIA in the last 168 hours.  Cardiac Enzymes: No results for input(s): CKTOTAL, CKMB, CKMBINDEX, TROPONINI in the last 168 hours.  BNP (last 3 results) No results for input(s): BNP in the last 8760 hours.  ProBNP (last 3 results) No results for input(s): PROBNP in the last 8760 hours.  CBG: No results for input(s): GLUCAP in the last 168 hours.  Lipase     Component Value Date/Time   LIPASE 35 05/15/2019 1415     Urinalysis    Component Value Date/Time   COLORURINE YELLOW 05/15/2019 1427   APPEARANCEUR CLEAR 05/15/2019 1427   LABSPEC 1.020 05/15/2019 1427   PHURINE 7.0 05/15/2019 1427   GLUCOSEU NEGATIVE 05/15/2019 1427   HGBUR NEGATIVE 05/15/2019 1427   BILIRUBINUR NEGATIVE 05/15/2019 1427   KETONESUR NEGATIVE 05/15/2019 1427   PROTEINUR 30 (A) 05/15/2019 1427   NITRITE NEGATIVE 05/15/2019 1427   LEUKOCYTESUR NEGATIVE 05/15/2019 1427     Drugs of Abuse  No results found for: LABOPIA, [place], [place], [place], [facility], LABBARB    Radiological Exams on Admission: No results found.  EKG: none  Assessment/Plan Principal Problem:   Anemia Active Problems:   Tobacco user   Accelerated hypertension   Headache  Severe anemia likely secondary to chronic blood loss from menorrhagia.  Hemoglobin of 6.3 on presentation.  Will transfuse 1 unit of packed RBC.  Patient will benefit from iron on discharge.  Patient was advised to follow-up with gynecologist as outpatient.  Heme occult was reported as positive but patient does not have any GI symptoms any history of melena.  Check iron profile.  Reticulocyte count within normal limits.  Headache throbbing.  History of aneurysm in the family.  CT angiogram of the head and neck was ordered from the ED.  Will follow.  Likely  headache from severe anemia and accelerated hypertension.  Will continue on Tylenol, start on antihypertensive, transfuse 1 unit of packed RBC today.  History of chronic nicotine use with chronic bronchitis.  Patient was counseled about it.  We will put the patient on nicotine patch while in hospital.  DVT Prophylaxis: SCD  Consultant: None  Code Status: Full code  Microbiology none  Antibiotics: None  Family Communication:  Patients' condition and plan of care including tests being ordered have been discussed with the patient and the patient's husband who indicate understanding and agree with the plan.  Disposition Plan: Home  Severity of Illness: The appropriate patient status for this patient is OBSERVATION. Observation status is judged to be reasonable and necessary in order to  provide the required intensity of service to ensure the patient's safety. The patient's presenting symptoms, physical exam findings, and initial radiographic and laboratory data in the context of their medical condition is felt to place them at decreased risk for further clinical deterioration. Furthermore, it is anticipated that the patient will be medically stable for discharge from the hospital within 2 midnights of admission. The following factors support the patient status of observation.   Signed, Flora Lipps, MD [facility] 05/15/2019

## 2019-05-16 DIAGNOSIS — N921 Excessive and frequent menstruation with irregular cycle: Secondary | ICD-10-CM

## 2019-05-16 LAB — TYPE AND SCREEN
ABO/RH(D): O POS
Antibody Screen: NEGATIVE
Unit division: 0
Unit division: 0

## 2019-05-16 LAB — CBC
HCT: 28.3 % — ABNORMAL LOW (ref 36.0–46.0)
Hemoglobin: 7.7 g/dL — ABNORMAL LOW (ref 12.0–15.0)
MCH: 18.2 pg — ABNORMAL LOW (ref 26.0–34.0)
MCHC: 27.2 g/dL — ABNORMAL LOW (ref 30.0–36.0)
MCV: 66.9 fL — ABNORMAL LOW (ref 80.0–100.0)
Platelets: 159 10*3/uL (ref 150–400)
RBC: 4.23 MIL/uL (ref 3.87–5.11)
RDW: 25.7 % — ABNORMAL HIGH (ref 11.5–15.5)
WBC: 6.9 10*3/uL (ref 4.0–10.5)
nRBC: 0 % (ref 0.0–0.2)

## 2019-05-16 LAB — BPAM RBC
Blood Product Expiration Date: 202105182359
Blood Product Expiration Date: 202105182359
ISSUE DATE / TIME: 202104141919
ISSUE DATE / TIME: 202104142213
Unit Type and Rh: 5100
Unit Type and Rh: 5100

## 2019-05-16 LAB — BASIC METABOLIC PANEL
Anion gap: 7 (ref 5–15)
BUN: 8 mg/dL (ref 6–20)
CO2: 24 mmol/L (ref 22–32)
Calcium: 8.9 mg/dL (ref 8.9–10.3)
Chloride: 108 mmol/L (ref 98–111)
Creatinine, Ser: 0.52 mg/dL (ref 0.44–1.00)
GFR calc Af Amer: >60 mL/min (ref >60)
GFR calc non Af Amer: >60 mL/min (ref >60)
Glucose, Bld: 88 mg/dL (ref 70–99)
Potassium: 3.8 mmol/L (ref 3.5–5.1)
Sodium: 139 mmol/L (ref 135–145)

## 2019-05-16 LAB — SARS CORONAVIRUS 2 (TAT 6-24 HRS): SARS Coronavirus 2: NEGATIVE

## 2019-05-16 LAB — HIV ANTIBODY (ROUTINE TESTING W REFLEX): HIV Screen 4th Generation wRfx: NONREACTIVE

## 2019-05-16 LAB — TSH: TSH: 2.853 u[IU]/mL (ref 0.350–4.500)

## 2019-05-16 LAB — BRAIN NATRIURETIC PEPTIDE: B Natriuretic Peptide: 256.2 pg/mL — ABNORMAL HIGH (ref 0.0–100.0)

## 2019-05-16 MED ORDER — FERROUS SULFATE 325 (65 FE) MG PO TABS: 325.0000 mg | ORAL_TABLET | Freq: Three times a day (TID) | ORAL | 3 refills | Status: DC

## 2019-05-16 MED ORDER — SODIUM CHLORIDE 0.9 % IV SOLN
510.0000 mg | Freq: Once | INTRAVENOUS | Status: AC
  Administered 2019-05-16: 10:00:00 510 mg via INTRAVENOUS
  Filled 2019-05-16: qty 510

## 2019-05-16 MED ORDER — NAPROXEN 500 MG PO TABS: 500.0000 mg | ORAL_TABLET | Freq: Two times a day (BID) | ORAL | 0 refills | Status: DC | PRN

## 2019-05-16 MED ORDER — CARVEDILOL 25 MG PO TABS: 25.0000 mg | ORAL_TABLET | Freq: Two times a day (BID) | ORAL | 1 refills | Status: DC

## 2019-05-16 MED ORDER — PANTOPRAZOLE SODIUM 40 MG PO TBEC: 40.0000 mg | DELAYED_RELEASE_TABLET | Freq: Every day | ORAL | 0 refills | Status: DC

## 2019-05-16 MED ORDER — CARVEDILOL 25 MG PO TABS
25.0000 mg | ORAL_TABLET | Freq: Two times a day (BID) | ORAL | Status: DC
  Administered 2019-05-16: 10:00:00 25 mg via ORAL
  Filled 2019-05-16: qty 1

## 2019-05-16 MED ORDER — NICOTINE 14 MG/24HR TD PT24: 14.0000 mg | MEDICATED_PATCH | Freq: Every day | TRANSDERMAL | 0 refills | Status: DC

## 2019-05-16 MED ORDER — HYDROCHLOROTHIAZIDE 25 MG PO TABS: 25.0000 mg | ORAL_TABLET | Freq: Every day | ORAL | 1 refills | Status: DC

## 2019-05-16 NOTE — Discharge Summary (Signed)
Physician Discharge Summary  Martha Garza [id number] DOB: 04-29-1975 DOA: 05/15/2019  PCP: Patient, No Pcp Per  Admit date: 05/15/2019 Discharge date: 05/16/2019  Admitted From: Home  Discharge disposition: Home   Recommendations for Outpatient Follow-Up:   . Follow up with your primary care provider on 06/13/2019 . Check CBC, BMP in the next visit.  Discharge Diagnosis:   Principal Problem:   Anemia Active Problems:   Tobacco user   Accelerated hypertension   Headache   Discharge Condition: Improved.  Diet recommendation: Low sodium, heart healthy.    Wound care: None.  Code status: Full.   History of Present Illness:   Martha Garza is a 44 y.o. female with past medical history of hypertension not on medication, chronic smoker, bronchitis, history of menorrhagia and vaginal bleeding presented to the hospital with headache and nausea for1 week prior to presentation.  She also had headache which was recently getting worse.  ED Course: In the ED, patient was noted to have high blood pressure and was severely anemic.  PRBC transfusion as arranged from the ED and patient was considered for admission to the hospital.  Patient did have hemoglobin of 6.3 on presentation.  Blood pressure was 180/ 86.   Hospital Course:   Following conditions were addressed during hospitalization as listed below,  Severe iron deficiency anemia likely secondary to chronic blood loss from menorrhagia.  Hemoglobin of 6.3 on presentation.    Patient received 1 unit of packed RBC.  Will give 1 dose of IV Feraheme prior to discharge.  Patient will be given iron supplementation on discharge and has been counseled to continue to take iron on discharge.   Patient was advised to follow-up with gynecologist as outpatient.  Heme occult was reported as positive but patient does not have any GI symptoms, any history of melena.    Iron profile showed serum ferritin of 2, iron of 13 and severe iron  deficiency anemia. Reticulocyte count within normal limits.  Accelerated hypertension on presentation.  Not on any medication at home.  Patient has been started on Coreg and hydrochlorothiazide during hospitalization with improvement in her blood pressure.  This will need to be followed up as outpatient for  optimization.    Headache .  History of aneurysm in the family.  CT angiogram of the head and neck show any evidence of aneurysm but in simple of malacia the left cerebellar area.   Likely headache from severe anemia and accelerated hypertension.    At this time, patient has received packed RBC transfusion iron transfusion in the blood pressure medications have been initiated.  History of chronic nicotine use with chronic bronchitis.  Patient was counseled about it.  Prescribed nicotine patch during discharge.  Disposition.  At this time, patient is stable for disposition home.  Patient will have to follow-up with primary care provider as outpatient.  Medical Consultants:    None.  Procedures:    Transfusion of packed RBC Subjective:   Today, patient feels better today.  Denies overt headache nausea or vomiting.  Discharge Exam:   Vitals:   05/16/19 0536 05/16/19 1154  BP: (!) 180/91 (!) 154/82  Pulse: 64 63  Resp: 14 18  Temp: 98.4 F (36.9 C) 98.2 F (36.8 C)  SpO2: 100% 100%   Vitals:   05/15/19 2234 05/16/19 0051 05/16/19 0536 05/16/19 1154  BP: (!) 181/93  (!) 180/91 (!) 154/82  Pulse: 68 64 64 63  Resp: 18 19 14 18   Temp: 98.7  F (37.1 C) 98.5 F (36.9 C) 98.4 F (36.9 C) 98.2 F (36.8 C)  TempSrc: Oral Oral  Oral  SpO2: 100% 100% 100% 100%  Weight:   96.7 kg   Height:       Body mass index is 35.48 kg/m.  General: Alert awake, not in obvious distress, morbidly obese HENT: pupils equally reacting to light, pallor noted.  Oral mucosa is moist.  Chest:  Clear breath sounds.  Diminished breath sounds bilaterally. No crackles or wheezes.  CVS: S1 &S2  heard. No murmur.  Regular rate and rhythm. Abdomen: Soft, nontender, nondistended.  Bowel sounds are heard.   Extremities: No cyanosis, clubbing or edema.  Peripheral pulses are palpable. Psych: Alert, awake and oriented, normal mood CNS:  No cranial nerve deficits.  Power equal in all extremities.   Skin: Warm and dry.  No rashes noted.  The results of significant diagnostics from this hospitalization (including imaging, microbiology, ancillary and laboratory) are listed below for reference.     Diagnostic Studies:   CT Angio Head W or Wo Contrast  Result Date: 05/15/2019 CLINICAL DATA:  Headache, family history of aneurysm EXAM: CT ANGIOGRAPHY HEAD AND NECK TECHNIQUE: Multidetector CT imaging of the head and neck was performed using the standard protocol during bolus administration of intravenous contrast. Multiplanar CT image reconstructions and MIPs were obtained to evaluate the vascular anatomy. Carotid stenosis measurements (when applicable) are obtained utilizing NASCET criteria, using the distal internal carotid diameter as the denominator. CONTRAST:  163mL OMNIPAQUE IOHEXOL 350 MG/ML SOLN COMPARISON:  None. FINDINGS: CT HEAD Brain: There is no acute intracranial hemorrhage, mass effect, or edema. Gray-white differentiation is preserved. Left cerebellar encephalomalacia probably reflecting prior infarct. There is no extra-axial fluid collection. Ventricles and sulci are within normal limits in size and configuration. Partially empty sella. Vascular: No hyperdense vessel or unexpected calcification. Skull: Calvarium is unremarkable. Sinuses/Orbits: Patchy mucosal thickening.  Orbits are unremarkable. Other: None. Review of the MIP images confirms the above findings CTA NECK Aortic arch: Great vessel origins are patent. Right carotid system: Patent.  No measurable stenosis. Left carotid system: Patent.  No measurable stenosis. Vertebral arteries: Patent and codominant.  No measurable stenosis  Skeleton: Unremarkable. Other neck: No mass or adenopathy. Subcentimeter partially calcified thyroid nodules. No further follow-up is recommended per current guidelines. Upper chest: No apical lung mass. Review of the MIP images confirms the above findings CTA HEAD Anterior circulation: Intracranial internal carotid arteries are patent. Anterior and middle cerebral arteries are patent. An anterior communicating artery is present. Posterior circulation: Intracranial vertebral arteries, basilar artery, and posterior cerebral arteries are patent. A right posterior communicating artery is present. Venous sinuses: Patent as allowed by contrast bolus timing. Review of the MIP images confirms the above findings IMPRESSION: No acute intracranial abnormality. 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 No large vessel occlusion, hemodynamically significant stenosis, or evidence of dissection. No aneurysm. Electronically Signed   By: Macy Mis M.D.   On: 05/15/2019 17:48   CT Angio Neck W and/or Wo Contrast  Result Date: 05/15/2019 CLINICAL DATA:  Headache, family history of aneurysm EXAM: CT ANGIOGRAPHY HEAD AND NECK TECHNIQUE: Multidetector CT imaging of the head and neck was performed using the standard protocol during bolus administration of intravenous contrast. Multiplanar CT image reconstructions and MIPs were obtained to evaluate the vascular anatomy. Carotid stenosis measurements (when applicable) are obtained utilizing NASCET criteria, using the distal internal carotid diameter as the denominator. CONTRAST:  176mL OMNIPAQUE IOHEXOL 350 MG/ML  SOLN COMPARISON:  None. FINDINGS: CT HEAD Brain: There is no acute intracranial hemorrhage, mass effect, or edema. Gray-white differentiation is preserved. Left cerebellar encephalomalacia probably reflecting prior infarct. There is no extra-axial fluid collection. Ventricles and sulci are within normal limits in size and configuration.  Partially empty sella. Vascular: No hyperdense vessel or unexpected calcification. Skull: Calvarium is unremarkable. Sinuses/Orbits: Patchy mucosal thickening.  Orbits are unremarkable. Other: None. Review of the MIP images confirms the above findings CTA NECK Aortic arch: Great vessel origins are patent. Right carotid system: Patent.  No measurable stenosis. Left carotid system: Patent.  No measurable stenosis. Vertebral arteries: Patent and codominant.  No measurable stenosis Skeleton: Unremarkable. Other neck: No mass or adenopathy. Subcentimeter partially calcified thyroid nodules. No further follow-up is recommended per current guidelines. Upper chest: No apical lung mass. Review of the MIP images confirms the above findings CTA HEAD Anterior circulation: Intracranial internal carotid arteries are patent. Anterior and middle cerebral arteries are patent. An anterior communicating artery is present. Posterior circulation: Intracranial vertebral arteries, basilar artery, and posterior cerebral arteries are patent. A right posterior communicating artery is present. Venous sinuses: Patent as allowed by contrast bolus timing. Review of the MIP images confirms the above findings IMPRESSION: No acute intracranial abnormality. 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 No large vessel occlusion, hemodynamically significant stenosis, or evidence of dissection. No aneurysm. Electronically Signed   By: Macy Mis M.D.   On: 05/15/2019 17:48     Labs:   Basic Metabolic Panel: Recent Labs  Lab 05/15/19 1415 05/16/19 0316  NA 142 139  K 4.2 3.8  CL 111 108  CO2 25 24  GLUCOSE 92 88  BUN 10 8  CREATININE 0.56 0.52  CALCIUM 8.8* 8.9   GFR Estimated Creatinine Clearance: 105.4 mL/min (by C-G formula based on SCr of 0.52 mg/dL). Liver Function Tests: Recent Labs  Lab 05/15/19 1415  AST 12*  ALT 13  ALKPHOS 50  BILITOT 0.6  PROT 6.8  ALBUMIN 3.9   Recent Labs  Lab  05/15/19 1415  LIPASE 35   No results for input(s): AMMONIA in the last 168 hours. Coagulation profile No results for input(s): INR, PROTIME in the last 168 hours.  CBC: Recent Labs  Lab 05/15/19 1415 05/16/19 0316  WBC 6.4 6.9  HGB 6.3* 7.7*  HCT 25.4* 28.3*  MCV 63.3* 66.9*  PLT 191 159   Cardiac Enzymes: No results for input(s): CKTOTAL, CKMB, CKMBINDEX, TROPONINI in the last 168 hours. BNP: Invalid input(s): POCBNP CBG: No results for input(s): GLUCAP in the last 168 hours. D-Dimer No results for input(s): DDIMER in the last 72 hours. Hgb A1c No results for input(s): HGBA1C in the last 72 hours. Lipid Profile No results for input(s): CHOL, HDL, LDLCALC, TRIG, CHOLHDL, LDLDIRECT in the last 72 hours. Thyroid function studies Recent Labs    05/16/19 0316  TSH 2.853   Anemia work up Recent Labs    05/15/19 1413 05/15/19 1640  VITAMINB12  --  334  FOLATE  --  5.6*  FERRITIN  --  2*  TIBC  --  560*  IRON  --  13*  RETICCTPCT 1.4  --    Microbiology Recent Results (from the past 240 hour(s))  SARS CORONAVIRUS 2 (TAT 6-24 HRS) Nasopharyngeal Nasopharyngeal Swab     Status: None   Collection Time: 05/15/19  4:38 PM   Specimen: Nasopharyngeal Swab  Result Value Ref Range Status   SARS Coronavirus 2 NEGATIVE NEGATIVE Final  Comment: (NOTE) SARS-CoV-2 target nucleic acids are NOT DETECTED. The SARS-CoV-2 RNA is generally detectable in upper and lower respiratory specimens during the acute phase of infection. Negative results do not preclude SARS-CoV-2 infection, do not rule out co-infections with other pathogens, and should not be used as the sole basis for treatment or other patient management decisions. Negative results must be combined with clinical observations, patient history, and epidemiological information. The expected result is Negative. Fact Sheet for Patients: SugarRoll.be Fact Sheet for Healthcare  Providers: https://www.woods-mathews.com/ This test is not yet approved or cleared by the [place] FDA and  has been authorized for detection and/or diagnosis of SARS-CoV-2 by FDA under an Emergency Use Authorization (EUA). This EUA will remain  in effect (meaning this test can be used) for the duration of the COVID-19 declaration under Section 56 4(b)(1) of the Act, 21 U.S.C. section 360bbb-3(b)(1), unless the authorization is terminated or revoked sooner. Performed at [facility], [place] [address] [place], [place] [address]      Discharge Instructions:   Discharge Instructions    Diet - low sodium heart healthy   Complete by: As directed    Discharge instructions   Complete by: As directed    Please follow-up with your primary care physician within 1 week.  You will need to check your blood work  and adjust medications for high blood pressure as necessary.  Please discuss about having GYN oncology referral for your vaginal bleeding.  Please take iron tablet without interruption.  Continue to take your blood pressure medications without interruption.  Seek medical attention for worsening symptoms.  Increase physical activity, decrease portion size, low-salt diet.   Increase activity slowly   Complete by: As directed      Allergies as of 05/16/2019      Reactions   Elimite [permethrin] Hives   Losartan Hives   Penicillins Hives      Medication List    STOP taking these medications   clindamycin 150 MG capsule Commonly known as: CLEOCIN     TAKE these medications   albuterol 108 (90 Base) MCG/ACT inhaler Commonly known as: VENTOLIN HFA Inhale 2-4 puffs into the lungs every 4 (four) hours as needed for wheezing or shortness of breath.   carvedilol 25 MG tablet Commonly known as: COREG Take 1 tablet (25 mg total) by mouth 2 (two) times daily with a meal.   ferrous sulfate 325 (65 FE) MG tablet Take 1 tablet (325 mg total) by mouth 3 (three) times  daily with meals.   hydrochlorothiazide 25 MG tablet Commonly known as: HYDRODIURIL Take 1 tablet (25 mg total) by mouth daily.   naproxen 500 MG tablet Commonly known as: Naprosyn Take 1 tablet (500 mg total) by mouth 2 (two) times daily as needed for moderate pain (menstrual cramps).   nicotine 14 mg/24hr patch Commonly known as: NICODERM CQ - dosed in mg/24 hours Place 1 patch (14 mg total) onto the skin daily.   pantoprazole 40 MG tablet Commonly known as: Protonix Take 1 tablet (40 mg total) by mouth daily.       Time coordinating discharge: 39 minutes  Signed:  Malikai Gut  [facility] 05/16/2019, 12:36 PM

## 2019-05-16 NOTE — TOC Transition Note (Signed)
Transition of Care [facility]) - CM/SW Discharge Note   Patient Details  Name: Martha Garza MRN: [id number] Date of Birth: [birth year]  Transition of Care [facility] CM/SW Contact:  Trish Mage, LCSW Phone Number: 05/16/2019, 12:43 PM   Clinical Narrative:  Patient set up for PCP services through  [facility] patient care center next door. No further needs identified.  TOC sign off.    Final next level of care: Home/Self Care Barriers to Discharge: No Barriers Identified   Patient Goals and CMS Choice        Discharge Placement                       Discharge Plan and Services                                     Social Determinants of Health (SDOH) Interventions     Readmission Risk Interventions No flowsheet data found.

## 2019-05-27 ENCOUNTER — Other Ambulatory Visit: Payer: Self-pay

## 2019-05-27 ENCOUNTER — Encounter (HOSPITAL_COMMUNITY): Payer: Self-pay | Admitting: Emergency Medicine

## 2019-05-27 ENCOUNTER — Emergency Department (HOSPITAL_COMMUNITY)
Admission: EM | Admit: 2019-05-27 | Discharge: 2019-05-27 | Disposition: A | Discharge status: Home / Self Care | Payer: Self-pay | Attending: Emergency Medicine | Admitting: Emergency Medicine

## 2019-05-27 DIAGNOSIS — R599 Enlarged lymph nodes, unspecified: Secondary | ICD-10-CM | POA: Insufficient documentation

## 2019-05-27 DIAGNOSIS — R809 Proteinuria, unspecified: Secondary | ICD-10-CM | POA: Insufficient documentation

## 2019-05-27 DIAGNOSIS — F17210 Nicotine dependence, cigarettes, uncomplicated: Secondary | ICD-10-CM | POA: Insufficient documentation

## 2019-05-27 DIAGNOSIS — I10 Essential (primary) hypertension: Secondary | ICD-10-CM | POA: Insufficient documentation

## 2019-05-27 DIAGNOSIS — N939 Abnormal uterine and vaginal bleeding, unspecified: Secondary | ICD-10-CM | POA: Insufficient documentation

## 2019-05-27 LAB — CBC WITH DIFFERENTIAL/PLATELET
Abs Immature Granulocytes: 0.03 10*3/uL (ref 0.00–0.07)
Basophils Absolute: 0.1 10*3/uL (ref 0.0–0.1)
Basophils Relative: 1 %
Eosinophils Absolute: 0.4 10*3/uL (ref 0.0–0.5)
Eosinophils Relative: 5 %
HCT: 38.4 % (ref 36.0–46.0)
Hemoglobin: 10.5 g/dL — ABNORMAL LOW (ref 12.0–15.0)
Immature Granulocytes: 0 %
Lymphocytes Relative: 21 %
Lymphs Abs: 1.6 10*3/uL (ref 0.7–4.0)
MCH: 19.9 pg — ABNORMAL LOW (ref 26.0–34.0)
MCHC: 27.3 g/dL — ABNORMAL LOW (ref 30.0–36.0)
MCV: 72.9 fL — ABNORMAL LOW (ref 80.0–100.0)
Monocytes Absolute: 0.3 10*3/uL (ref 0.1–1.0)
Monocytes Relative: 4 %
Neutro Abs: 5.1 10*3/uL (ref 1.7–7.7)
Neutrophils Relative %: 69 %
Platelets: 391 10*3/uL (ref 150–400)
RBC: 5.27 MIL/uL — ABNORMAL HIGH (ref 3.87–5.11)
RDW: 33.4 % — ABNORMAL HIGH (ref 11.5–15.5)
WBC: 7.5 10*3/uL (ref 4.0–10.5)
nRBC: 0 % (ref 0.0–0.2)

## 2019-05-27 LAB — COMPREHENSIVE METABOLIC PANEL
ALT: 11 U/L (ref 0–44)
AST: 13 U/L — ABNORMAL LOW (ref 15–41)
Albumin: 4.1 g/dL (ref 3.5–5.0)
Alkaline Phosphatase: 45 U/L (ref 38–126)
Anion gap: 11 (ref 5–15)
BUN: 19 mg/dL (ref 6–20)
CO2: 25 mmol/L (ref 22–32)
Calcium: 9.1 mg/dL (ref 8.9–10.3)
Chloride: 103 mmol/L (ref 98–111)
Creatinine, Ser: 0.74 mg/dL (ref 0.44–1.00)
GFR calc Af Amer: >60 mL/min (ref >60)
GFR calc non Af Amer: >60 mL/min (ref >60)
Glucose, Bld: 99 mg/dL (ref 70–99)
Potassium: 3.7 mmol/L (ref 3.5–5.1)
Sodium: 139 mmol/L (ref 135–145)
Total Bilirubin: 1.4 mg/dL — ABNORMAL HIGH (ref 0.3–1.2)
Total Protein: 7 g/dL (ref 6.5–8.1)

## 2019-05-27 LAB — TYPE AND SCREEN
ABO/RH(D): O POS
Antibody Screen: NEGATIVE

## 2019-05-27 LAB — URINALYSIS, ROUTINE W REFLEX MICROSCOPIC
Bacteria, UA: NONE SEEN
Bilirubin Urine: NEGATIVE
Glucose, UA: NEGATIVE mg/dL
Hgb urine dipstick: NEGATIVE
Ketones, ur: NEGATIVE mg/dL
Nitrite: NEGATIVE
Protein, ur: 30 mg/dL — AB
Specific Gravity, Urine: 1.019 (ref 1.005–1.030)
pH: 5 (ref 5.0–8.0)

## 2019-05-27 LAB — I-STAT BETA HCG BLOOD, ED (MC, WL, AP ONLY): I-stat hCG, quantitative: <5 m[IU]/mL (ref <5)

## 2019-05-27 NOTE — ED Provider Notes (Signed)
Martha Garza Provider Note   CSN: [id number] Arrival date & time: 05/27/19  1224     History Chief Complaint  Patient presents with  . groin mass    Martha Garza is a 44 y.o. female.  44 year old female presents with complaint of a tender mass in her left pelvic area.  Patient states the area was more prominent last night, today less prominent although the whole area is sore to the touch.  Patient is concerned because she was recently admitted for anemia and given a blood transfusion yet continues to have irregular menstrual cycles.  Patient is scheduled to follow-up with women's clinic next month.  No other complaints or concerns today.        Past Medical History:  Diagnosis Date  . Bronchitis   . Hypertension     Patient Active Problem List   Diagnosis Date Noted  . Anemia 05/15/2019  . Accelerated hypertension 05/15/2019  . Headache 05/15/2019  . Moderate persistent asthma without complication [id number]  . Rash and other nonspecific skin eruption 03/29/2018  . Tobacco user 03/29/2018  . Pruritus 03/29/2018  . Adverse food reaction 03/29/2018  . Drug reaction 03/29/2018    History reviewed. No pertinent surgical history.   OB History   No obstetric history on file.     Family History  Problem Relation Age of Onset  . Cancer Mother   . Cancer Father   . Cancer Niece     Social History   Tobacco Use  . Smoking status: Current Every Day Smoker    Packs/day: 0.35    Years: 30.00    Pack years: 10.50    Types: Cigarettes  . Smokeless tobacco: Never Used  Substance Use Topics  . Alcohol use: Never  . Drug use: Never    Home Medications Prior to Admission medications   Medication Sig Start Date End Date Taking? Authorizing Provider  albuterol (PROVENTIL HFA;VENTOLIN HFA) 108 (90 Base) MCG/ACT inhaler Inhale 2-4 puffs into the lungs every 4 (four) hours as needed for wheezing or shortness of breath.  01/27/18    [provider]  carvedilol (COREG) 25 MG tablet Take 1 tablet (25 mg total) by mouth 2 (two) times daily with a meal. 05/16/19   Pokhrel, Laxman, MD  ferrous sulfate 325 (65 FE) MG tablet Take 1 tablet (325 mg total) by mouth 3 (three) times daily with meals. 05/16/19   Pokhrel, Corrie Mckusick, MD  hydrochlorothiazide (HYDRODIURIL) 25 MG tablet Take 1 tablet (25 mg total) by mouth daily. 05/16/19   Pokhrel, Corrie Mckusick, MD  naproxen (NAPROSYN) 500 MG tablet Take 1 tablet (500 mg total) by mouth 2 (two) times daily as needed for moderate pain (menstrual cramps). 05/16/19   Pokhrel, Corrie Mckusick, MD  nicotine (NICODERM CQ - DOSED IN MG/24 HOURS) 14 mg/24hr patch Place 1 patch (14 mg total) onto the skin daily. 05/16/19   Pokhrel, Corrie Mckusick, MD  pantoprazole (PROTONIX) 40 MG tablet Take 1 tablet (40 mg total) by mouth daily. 05/16/19 06/15/19  Pokhrel, Corrie Mckusick, MD    Allergies    Elimite [permethrin], Losartan, and Penicillins  Review of Systems   Review of Systems  Constitutional: Negative for fatigue and fever.  Gastrointestinal: Negative for abdominal pain, constipation, diarrhea, nausea and vomiting.  Genitourinary: Positive for vaginal bleeding.  Musculoskeletal: Negative for arthralgias and myalgias.  Skin: Negative for color change, rash and wound.  Allergic/Immunologic: Negative for immunocompromised state.  Neurological: Negative for weakness.  All other systems reviewed and are  negative.   Physical Exam Updated Vital Signs BP (!) 144/86 (BP Location: Left Arm)   Pulse 67   Temp 98.2 F (36.8 C) (Oral)   Resp 18   Ht 5\' 4"  (1.626 m)   Wt 93.9 kg   LMP 05/24/2019   SpO2 99%   BMI 35.53 kg/m   Physical Exam Vitals and nursing note reviewed.  Constitutional:      General: She is not in acute distress.    Appearance: She is well-developed. She is not diaphoretic.  HENT:     Head: Normocephalic and atraumatic.  Pulmonary:     Effort: Pulmonary effort is normal.  Skin:    General: Skin is  warm and dry.     Findings: No bruising, erythema, lesion or rash.       Neurological:     Mental Status: She is alert and oriented to person, place, and time.  Psychiatric:        Behavior: Behavior normal.     ED Results / Procedures / Treatments   Labs (all labs ordered are listed, but only abnormal results are displayed) Labs Reviewed  COMPREHENSIVE METABOLIC PANEL - Abnormal; Notable for the following components:      Result Value   AST 13 (*)    Total Bilirubin 1.4 (*)    All other components within normal limits  CBC WITH DIFFERENTIAL/PLATELET - Abnormal; Notable for the following components:   RBC 5.27 (*)    Hemoglobin 10.5 (*)    MCV 72.9 (*)    MCH 19.9 (*)    MCHC 27.3 (*)    RDW 33.4 (*)    All other components within normal limits  URINALYSIS, ROUTINE W REFLEX MICROSCOPIC - Abnormal; Notable for the following components:   Protein, ur 30 (*)    Leukocytes,Ua TRACE (*)    All other components within normal limits  I-STAT BETA HCG BLOOD, ED ([facility], [facility], [facility] ONLY)  TYPE AND SCREEN    EKG None  Radiology No results found.  Procedures Procedures (including critical care time)  Medications Ordered in ED Medications - No data to display  ED Course  I have reviewed the triage vital signs and the nursing notes.  Pertinent labs & imaging results that were available during my care of the patient were reviewed by me and considered in my medical decision making (see chart for details).  Clinical Course as of May 27 1830  Mon May 27, 2019  1829 44yo female with concern for left pelvic mass which she noticed last night, area is generally tender today. On exam, area appears consistent with a reactive LN in the subcutaneous tissue. Recommend monitor the area or redness, worsening pain, persistent LN. Possibly secondary to shaving the area. Reviewed labs, patient recently admitted for transfusion, Hgb improving to 10.5 today, protein in urine (also on previous visit),  recommend recheck with PCP.    [LM]    Clinical Course User Index [LM] Martha Garza   MDM Rules/Calculators/A&P                      Final Clinical Impression(s) / ED Diagnoses Final diagnoses:  Lymph node enlargement  Proteinuria, unspecified type    Rx / DC Orders ED Discharge Orders    None       Tacy Learn, PA-C 05/27/19 1832    Daleen Bo, MD 05/28/19 [phone]

## 2019-05-27 NOTE — Discharge Instructions (Addendum)
[facility] for [place] GYN clinic 4PM-7:30PM Monday-Wednesday  Follow up with your doctor for recheck regarding protein in your urine.

## 2019-05-27 NOTE — ED Triage Notes (Signed)
Per pt, states she had a transfusion about 1-2 weeks ago-states they think it is related to her menstrual cycle-states heavy bleeding with last period which ended a couple of days ago-states she noticed palpable mass in left groin-not sure if symptoms are related

## 2019-06-13 ENCOUNTER — Ambulatory Visit: Payer: Self-pay | Admitting: Nurse Practitioner

## 2019-06-27 ENCOUNTER — Encounter: Payer: Self-pay | Admitting: Family Medicine

## 2019-07-09 ENCOUNTER — Ambulatory Visit: Payer: Self-pay | Admitting: Family Medicine

## 2019-09-13 ENCOUNTER — Ambulatory Visit (INDEPENDENT_AMBULATORY_CARE_PROVIDER_SITE_OTHER): Payer: Self-pay | Admitting: Internal Medicine

## 2019-09-13 ENCOUNTER — Encounter: Payer: Self-pay | Admitting: Internal Medicine

## 2019-09-13 ENCOUNTER — Other Ambulatory Visit: Payer: Self-pay

## 2019-09-13 VITALS — BP 169/86 | HR 79 | Temp 98.0°F | Ht 64.0 in | Wt 217.2 lb

## 2019-09-13 DIAGNOSIS — I10 Essential (primary) hypertension: Secondary | ICD-10-CM

## 2019-09-13 DIAGNOSIS — D500 Iron deficiency anemia secondary to blood loss (chronic): Secondary | ICD-10-CM

## 2019-09-13 MED ORDER — HYDROCHLOROTHIAZIDE 25 MG PO TABS: 25.0000 mg | ORAL_TABLET | Freq: Every day | ORAL | 1 refills | Status: DC

## 2019-09-13 NOTE — Patient Instructions (Signed)
Thank you for allowing [place] to provide your care today. TYou came in to establish care here.  Your blood pressure is elevated today. I send a refill for your HCTZ (Hydrochlorothiazide). Please take 1 tablet daily.  I send you to lab for some blood work. We will call you if the results will be abnormal. Come back to clinic in 2 week to see your PCP. Per your permission, we will request your prior doctors to send your medical records to [place] meanwhile. I also provided you a work note for today.  Please come back to clinic in 2 weeks or earlier if needed. As always, if having severe symptoms, please seek medical attention at emergency room. Should you have any questions or concerns please call the internal medicine clinic at [phone].    Thank you!

## 2019-09-13 NOTE — Assessment & Plan Note (Signed)
This is the first encounter at [facility]. Patient is here to establish care. Her blood pressure today is 169/86.  Patient has been off of her medications for about 3 months.  Per chart, she was supposed to be on Coreg 25 mg twice daily and HCTZ 25 mg daily).  She is asymptomatic and denies any headache, chest pain, dizziness. She does not check her blood pressure at home does not know her baseline.  -We will send request for her medical record to be faxed to [facility]. (Patient signed MR release form). -I sent refill for her HCTZ 5 mg daily for now and will reassess her in 2 weeks. Will consider adding her Coreg if appropriate and after having her medical record. -BMP today -Follow-up in clinic in 2 weeks to see PCP

## 2019-09-13 NOTE — Assessment & Plan Note (Signed)
Checking CBC today 

## 2019-09-13 NOTE — Progress Notes (Signed)
New Patient Office Visit  Subjective:  Patient ID: Martha Garza, female    DOB: Apr 29, 1975  Age: 44 y.o. MRN: [id number]  CC: To establish care  Chief Complaint  Patient presents with   Hypertension    HPI Martha Garza presents for establishing care. She moved to [place] about 3 years ago. She did not see a primary care physician in past 3 years.  However she had ED visit on April for symptomatic anemia (secondary to menorrhagia-now resolved) and she received some refill for her blood pressure medication then.  She has been off of those medicines since 2 to 3 months ago. She is willing to establish care at [facility].  No acute complaint today.  Please refer to problem based charting for further details and assessment and plan of chronic medical conditions.  PMHx: HTN, Psoriasis (per patient)  Social Hx: Smokes a pack of cigarettes every 3 days, Drinks socially, No illicit drug  Past Medical History:  Diagnosis Date   Bronchitis    Hypertension     No past surgical history on file.  Family History  Problem Relation Age of Onset   Cancer Mother    Cancer Father    Cancer Niece     Social History   Socioeconomic History   Marital status: Married    Spouse name: Not on file   Number of children: Not on file   Years of education: Not on file   Highest education level: Not on file  Occupational History   Not on file  Tobacco Use   Smoking status: Current Every Day Smoker    Packs/day: 0.35    Years: 30.00    Pack years: 10.50    Types: Cigarettes   Smokeless tobacco: Never Used  Vaping Use   Vaping Use: Never used  Substance and Sexual Activity   Alcohol use: Never   Drug use: Never   Sexual activity: Not on file  Other Topics Concern   Not on file  Social History Narrative   Not on file   Social Determinants of Health   Financial Resource Strain:    Difficulty of Paying Living Expenses:   Food Insecurity:    Worried About Paediatric nurse in the Last Year:    Arboriculturist in the Last Year:   Transportation Needs:    Film/video editor (Medical):    Lack of Transportation (Non-Medical):   Physical Activity:    Days of Exercise per Week:    Minutes of Exercise per Session:   Stress:    Feeling of Stress :   Social Connections:    Frequency of Communication with Friends and Family:    Frequency of Social Gatherings with Friends and Family:    Attends Religious Services:    Active Member of Clubs or Organizations:    Attends Music therapist:    Marital Status:   Intimate Partner Violence:    Fear of Current or Ex-Partner:    Emotionally Abused:    Physically Abused:    Sexually Abused:     ROS Review of Systems  Objective:   Today's Vitals: BP (!) 169/86 (BP Location: Right Arm, Patient Position: Sitting, Cuff Size: Small)    Pulse 79    Temp 98 F (36.7 C) (Oral)    Ht 5\' 4"  (1.626 m)    Wt 217 lb 3.2 oz (98.5 kg)    SpO2 99%    BMI 37.28 kg/m  Physical Exam  Constitutional: Well-developed and well-nourished. No acute distress.  HENT:  Head: Normocephalic and atraumatic.  Eyes: Conjunctivae are normal, EOM nl Cardiovascular:  RRR, nl S1S2, no murmur,  no LEE Respiratory: Effort normal and breath sounds normal. No respiratory distress. No wheezes.  GI: Soft. Bowel sounds are normal. No distension. There is no tenderness.  Neurological: Is alert and oriented x 3  Skin: Not diaphoretic.   Assessment & Plan:   Problem List Items Addressed This Visit    None      Outpatient Encounter Medications as of 09/13/2019  Medication Sig   albuterol (PROVENTIL HFA;VENTOLIN HFA) 108 (90 Base) MCG/ACT inhaler Inhale 2-4 puffs into the lungs every 4 (four) hours as needed for wheezing or shortness of breath.    carvedilol (COREG) 25 MG tablet Take 1 tablet (25 mg total) by mouth 2 (two) times daily with a meal.   ferrous sulfate 325 (65 FE) MG tablet Take 1 tablet  (325 mg total) by mouth 3 (three) times daily with meals.   hydrochlorothiazide (HYDRODIURIL) 25 MG tablet Take 1 tablet (25 mg total) by mouth daily.   naproxen (NAPROSYN) 500 MG tablet Take 1 tablet (500 mg total) by mouth 2 (two) times daily as needed for moderate pain (menstrual cramps).   nicotine (NICODERM CQ - DOSED IN MG/24 HOURS) 14 mg/24hr patch Place 1 patch (14 mg total) onto the skin daily.   pantoprazole (PROTONIX) 40 MG tablet Take 1 tablet (40 mg total) by mouth daily.   No facility-administered encounter medications on file as of 09/13/2019.    Follow-up: No follow-ups on file.   Dewayne Hatch, MD

## 2019-09-14 LAB — BMP8+ANION GAP
Anion Gap: 18 mmol/L (ref 10.0–18.0)
BUN/Creatinine Ratio: 9 (ref 9–23)
BUN: 6 mg/dL (ref 6–24)
CO2: 23 mmol/L (ref 20–29)
Calcium: 8.7 mg/dL (ref 8.7–10.2)
Chloride: 102 mmol/L (ref 96–106)
Creatinine, Ser: 0.69 mg/dL (ref 0.57–1.00)
GFR calc Af Amer: 124 mL/min/{1.73_m2} (ref >59)
GFR calc non Af Amer: 108 mL/min/{1.73_m2} (ref >59)
Glucose: 88 mg/dL (ref 65–99)
Potassium: 3.6 mmol/L (ref 3.5–5.2)
Sodium: 143 mmol/L (ref 134–144)

## 2019-09-14 LAB — CBC
Hematocrit: 31.8 % — ABNORMAL LOW (ref 34.0–46.6)
Hemoglobin: 9.3 g/dL — ABNORMAL LOW (ref 11.1–15.9)
MCH: 21.6 pg — ABNORMAL LOW (ref 26.6–33.0)
MCHC: 29.2 g/dL — ABNORMAL LOW (ref 31.5–35.7)
MCV: 74 fL — ABNORMAL LOW (ref 79–97)
Platelets: 410 10*3/uL (ref 150–450)
RBC: 4.3 x10E6/uL (ref 3.77–5.28)
RDW: 15.3 % (ref 11.7–15.4)
WBC: 5.3 10*3/uL (ref 3.4–10.8)

## 2019-09-16 NOTE — Progress Notes (Signed)
Internal Medicine Clinic Attending  Case discussed with Dr. Myrtie Hawk  At the time of the visit.  We reviewed the resident's history and exam and pertinent patient test results.  I agree with the assessment, diagnosis, and plan of care documented in the resident's note. Note dosage of HCTZ is 25mg  daily

## 2019-10-08 ENCOUNTER — Encounter: Payer: Medicaid Other | Admitting: Student

## 2019-11-26 ENCOUNTER — Other Ambulatory Visit: Payer: Self-pay

## 2019-11-26 ENCOUNTER — Encounter: Payer: Self-pay | Admitting: Student

## 2019-11-26 ENCOUNTER — Ambulatory Visit: Payer: Self-pay | Admitting: Student

## 2019-11-26 DIAGNOSIS — R21 Rash and other nonspecific skin eruption: Secondary | ICD-10-CM

## 2019-11-26 DIAGNOSIS — M79602 Pain in left arm: Secondary | ICD-10-CM

## 2019-11-26 DIAGNOSIS — I10 Essential (primary) hypertension: Secondary | ICD-10-CM

## 2019-11-26 MED ORDER — CARVEDILOL 25 MG PO TABS: 25.0000 mg | ORAL_TABLET | Freq: Two times a day (BID) | ORAL | 1 refills | Status: DC

## 2019-11-26 MED ORDER — CARVEDILOL 12.5 MG PO TABS: 12.5000 mg | ORAL_TABLET | Freq: Two times a day (BID) | ORAL | 3 refills | Status: DC

## 2019-11-26 MED ORDER — CETIRIZINE HCL 10 MG PO TABS: 10.0000 mg | ORAL_TABLET | Freq: Every day | ORAL | 2 refills | Status: DC

## 2019-11-26 MED ORDER — GABAPENTIN 100 MG PO CAPS: 100.0000 mg | ORAL_CAPSULE | Freq: Three times a day (TID) | ORAL | 2 refills | Status: DC

## 2019-11-26 NOTE — Patient Instructions (Addendum)
It was a pleasure seeing you in clinic. Today we discussed:   Rash on pals: Please start taking zyrtec 10 mg daily, can increase up to 2 times a day to help prevent more break outs on the palms. Please keep a food diary to see if you notice any trigger to you symptoms.  Left arm pain: I have ordered gabapentin 100 mg  Three times a day for the pain. We will order nerve conduction studies if you continue to have symptoms on January.  High blood pressure: Please start taking carvedilol 12.5 mg twice daily and follow up in 2 weeks for BP recheck.   If you have any questions or concerns, please call our clinic at [phone] between 9am-5pm and after hours call [phone] and ask for the internal medicine resident on call. If you feel you are having a medical emergency please call 911.   Thank you, we look forward to helping you remain healthy!  To schedule an appointment for a COVID vaccine or be added to the vaccine wait list: Go to [email or web address]   OR Go to [email or web address]                  OR Call [phone]                                     OR Call [phone] and select Option 2

## 2019-11-27 DIAGNOSIS — M79602 Pain in left arm: Secondary | ICD-10-CM | POA: Insufficient documentation

## 2019-11-27 NOTE — Assessment & Plan Note (Addendum)
Patient continues to be hypertensive on HCTZ with BP today of 167/102. Was on carvedilol 25 mg twice daily prior to discharge from hospital and had better BP control at that time. She denies chest pain, SOB, nausea, abdominal pain, lightheadedness, and weakness and dizziness.  Plan Continue HCTZ Start Carvedilol 12.5 mg twice daily Follow up in 2 weeks for BP recheck

## 2019-11-27 NOTE — Assessment & Plan Note (Signed)
Patient presenting with generalized arm pain with tingling and numbness that radiates from left neck down to her left MCPs. States pain starts sporadically throughout the day lasting about an hour at a time. She describes the pain as sharp and burning diffuse primarily in her forearm and hand. She denies trauma, injury, numbness and tingling of finger tips. On exam she has normal strength and ROM. Appears to have neuropathic pain may be coming from cervical spine. Currently in the process of enrolling in insurance with plan to have coverage by January of 2022.   Plan Start gabapentin 100 mg three times daily for pain Will order nerve conduction studies next year once she has better insurance if she continues to have this pain.

## 2019-11-27 NOTE — Assessment & Plan Note (Signed)
Patient with increased rashes on palms of hands for the last month with significant itching in the setting of eczema. Has been improving with topical emollients. On exam she has multiple scatter vesicles on bilateral palms. Was seen by allergy clinic in 2020 for the same problem was started on zyrtec 10 mg daily with good response.  Allergy testing at time negative although her histamine control was also negative. Will have her continue recommendations. If symptoms doe not improve will have her follow up possibly for further allergy testing.  Plan  Start zyrtec 10 mg daily Keep food journal to to see if she notices any triggers Continue with topical emollients

## 2019-11-27 NOTE — Progress Notes (Signed)
   CC: left arm pain  HPI:  [name] is a 44 y.o. female with history document below presents for left arm pain and numbness starting about 1 month ago. She also notes increased palmar rashes. Please refer to problem based charting for further details and assessment and plan of current problem and chronic medical conditions.   Past Medical History:  Diagnosis Date  . Bronchitis   . Hypertension    Review of Systems:  Negative as per hpi  Physical Exam:  Vitals:   11/26/19 0910  BP: (!) 167/102  Pulse: 81  Temp: 98.4 F (36.9 C)  TempSrc: Oral  Weight: 227 lb 4.8 oz (103.1 kg)   Physical Exam Constitutional:      Appearance: Normal appearance. She is obese.  HENT:     Head: Normocephalic and atraumatic.     Right Ear: External ear normal.     Left Ear: External ear normal.     Nose: Nose normal.     Mouth/Throat:     Mouth: Mucous membranes are moist.  Eyes:     Extraocular Movements: Extraocular movements intact.     Pupils: Pupils are equal, round, and reactive to light.  Cardiovascular:     Rate and Rhythm: Normal rate and regular rhythm.     Pulses: Normal pulses.     Heart sounds: Normal heart sounds.  Pulmonary:     Effort: Pulmonary effort is normal.     Breath sounds: Normal breath sounds.  Abdominal:     General: Abdomen is flat. Bowel sounds are normal.     Palpations: Abdomen is soft.  Musculoskeletal:     Cervical back: Normal range of motion and neck supple.     Comments: Some tenderness to palpation at left base of neck, numbness and tingling of left MCP joints no swelling, no pain with ROM of the shoulder on flexion, extension, internal and external rotation. No pain to palpation. Normal strength on elbow.  Skin:    General: Skin is warm and dry.     Comments: Dry skin of plantar surfaces of palms with multiple scatter vesicles and excoriations  Neurological:     General: No focal deficit present.     Mental Status: She is alert and  oriented to person, place, and time. Mental status is at baseline.      Assessment & Plan:   See Encounters Tab for problem based charting.  Patient seen with Dr. Daryll Drown

## 2019-12-02 NOTE — Progress Notes (Signed)
Internal Medicine Clinic Attending  I saw and evaluated the patient.  I personally confirmed the key portions of the history and exam documented by Dr. Liang and I reviewed pertinent patient test results.  The assessment, diagnosis, and plan were formulated together and I agree with the documentation in the resident's note.  

## 2019-12-10 ENCOUNTER — Encounter: Payer: Medicaid Other | Admitting: Internal Medicine

## 2019-12-10 NOTE — Progress Notes (Deleted)
Established Patient Office Visit  Subjective:  Patient ID: Martha Garza, female    DOB: 03/29/1975  Age: 44 y.o. MRN: [id number]  CC: No chief complaint on file. HTN follow-up.  HPI Myrtie Neither with past medical history significant for HTN, asthma, tobacco use, nonspecific rash, left arm pain presents for ***hypertension follow-up.***Please refer to problem based charting for further details and assessment and plan.  Past Medical History:  Diagnosis Date  . Bronchitis   . Hypertension    Medications: Albuterol as needed, carvedilol 12.5 mg twice daily, ferrous sulfate 325 mg 3 times daily, gabapentin 100 mg 3 times daily, HCTZ 25 mg daily, Protonix 40 mg daily, naproxen as needed.  No past surgical history on file.  Family History  Problem Relation Age of Onset  . Cancer Mother   . Cancer Father   . Cancer Niece     Social History   Socioeconomic History  . Marital status: Married    Spouse name: Not on file  . Number of children: Not on file  . Years of education: Not on file  . Highest education level: Not on file  Occupational History  . Not on file  Tobacco Use  . Smoking status: Current Every Day Smoker    Packs/day: 0.35    Years: 30.00    Pack years: 10.50    Types: Cigarettes  . Smokeless tobacco: Never Used  . Tobacco comment: varies according to situation  Vaping Use  . Vaping Use: Never used  Substance and Sexual Activity  . Alcohol use: Never  . Drug use: Never  . Sexual activity: Not on file  Other Topics Concern  . Not on file  Social History Narrative  . Not on file   Social Determinants of Health   Financial Resource Strain:   . Difficulty of Paying Living Expenses: Not on file  Food Insecurity:   . Worried About Charity fundraiser in the Last Year: Not on file  . Ran Out of Food in the Last Year: Not on file  Transportation Needs:   . Lack of Transportation (Medical): Not on file  . Lack of Transportation (Non-Medical): Not on  file  Physical Activity:   . Days of Exercise per Week: Not on file  . Minutes of Exercise per Session: Not on file  Stress:   . Feeling of Stress : Not on file  Social Connections:   . Frequency of Communication with Friends and Family: Not on file  . Frequency of Social Gatherings with Friends and Family: Not on file  . Attends Religious Services: Not on file  . Active Member of Clubs or Organizations: Not on file  . Attends Archivist Meetings: Not on file  . Marital Status: Not on file  Intimate Partner Violence:   . Fear of Current or Ex-Partner: Not on file  . Emotionally Abused: Not on file  . Physically Abused: Not on file  . Sexually Abused: Not on file    Outpatient Medications Prior to Visit  Medication Sig Dispense Refill  . albuterol (PROVENTIL HFA;VENTOLIN HFA) 108 (90 Base) MCG/ACT inhaler Inhale 2-4 puffs into the lungs every 4 (four) hours as needed for wheezing or shortness of breath.     . carvedilol (COREG) 12.5 MG tablet Take 1 tablet (12.5 mg total) by mouth 2 (two) times daily with a meal. 60 tablet 3  . cetirizine (ZYRTEC ALLERGY) 10 MG tablet Take 1 tablet (10 mg total) by mouth daily. Prompton  tablet 2  . ferrous sulfate 325 (65 FE) MG tablet Take 1 tablet (325 mg total) by mouth 3 (three) times daily with meals. 90 tablet 3  . gabapentin (NEURONTIN) 100 MG capsule Take 1 capsule (100 mg total) by mouth 3 (three) times daily. 90 capsule 2  . hydrochlorothiazide (HYDRODIURIL) 25 MG tablet Take 1 tablet (25 mg total) by mouth daily. 30 tablet 1  . naproxen (NAPROSYN) 500 MG tablet Take 1 tablet (500 mg total) by mouth 2 (two) times daily as needed for moderate pain (menstrual cramps). 20 tablet 0  . nicotine (NICODERM CQ - DOSED IN MG/24 HOURS) 14 mg/24hr patch Place 1 patch (14 mg total) onto the skin daily. 28 patch 0  . pantoprazole (PROTONIX) 40 MG tablet Take 1 tablet (40 mg total) by mouth daily. 30 tablet 0   No facility-administered medications  prior to visit.    Allergies  Allergen Reactions  . Elimite [Permethrin] Hives  . Losartan Hives  . Penicillins Hives    ROS Review of Systems    Objective:    Physical Exam  There were no vitals taken for this visit. Wt Readings from Last 3 Encounters:  11/26/19 227 lb 4.8 oz (103.1 kg)  09/13/19 217 lb 3.2 oz (98.5 kg)  05/27/19 207 lb (93.9 kg)   ***  Health Maintenance Due  Topic Date Due  . Hepatitis C Screening  Never done  . COVID-19 Vaccine (1) Never done  . TETANUS/TDAP  Never done  . PAP SMEAR-Modifier  Never done  . INFLUENZA VACCINE  Never done    There are no preventive care reminders to display for this patient.  Lab Results  Component Value Date   TSH 2.853 05/16/2019   Lab Results  Component Value Date   WBC 5.3 09/13/2019   HGB 9.3 (L) 09/13/2019   HCT 31.8 (L) 09/13/2019   MCV 74 (L) 09/13/2019   PLT 410 09/13/2019   Lab Results  Component Value Date   NA 143 09/13/2019   K 3.6 09/13/2019   CO2 23 09/13/2019   GLUCOSE 88 09/13/2019   BUN 6 09/13/2019   CREATININE 0.69 09/13/2019   BILITOT 1.4 (H) 05/27/2019   ALKPHOS 45 05/27/2019   AST 13 (L) 05/27/2019   ALT 11 05/27/2019   PROT 7.0 05/27/2019   ALBUMIN 4.1 05/27/2019   CALCIUM 8.7 09/13/2019   ANIONGAP 11 05/27/2019   No results found for: CHOL No results found for: HDL No results found for: LDLCALC No results found for: TRIG No results found for: CHOLHDL No results found for: HGBA1C    Assessment & Plan:   Problem List Items Addressed This Visit    None    HTN: Patient was seen in clinic 2 weeks ago.  She was a started on carvedilol 12.5 mg twice daily since her blood pressure was uncontrolled on HCTZ. BP today is***. Last BMP  BMP Latest Ref Rng & Units 09/13/2019 05/27/2019 05/16/2019  Glucose 65 - 99 mg/dL 88 99 88  BUN 6 - 24 mg/dL 6 19 8   Creatinine 0.57 - 1.00 mg/dL 0.69 0.74 0.52  BUN/Creat Ratio 9 - 23 9 - -  Sodium 134 - 144 mmol/L 143 139 139   Potassium 3.5 - 5.2 mmol/L 3.6 3.7 3.8  Chloride 96 - 106 mmol/L 102 103 108  CO2 20 - 29 mmol/L 23 25 24   Calcium 8.7 - 10.2 mg/dL 8.7 9.1 8.9   -Continue HCTZ and carvedilol***  Left arm  pain: ***  Rash:  ***   No orders of the defined types were placed in this encounter.   Follow-up: No follow-ups on file.   Patient discussed with Dr.***. Dewayne Hatch, MD

## 2019-12-17 ENCOUNTER — Encounter: Payer: Self-pay | Admitting: Internal Medicine

## 2019-12-17 ENCOUNTER — Ambulatory Visit: Payer: Self-pay | Admitting: Internal Medicine

## 2019-12-17 VITALS — BP 151/68 | HR 69 | Temp 98.2°F | Ht 64.0 in | Wt 230.5 lb

## 2019-12-17 DIAGNOSIS — I10 Essential (primary) hypertension: Secondary | ICD-10-CM

## 2019-12-17 DIAGNOSIS — D500 Iron deficiency anemia secondary to blood loss (chronic): Secondary | ICD-10-CM

## 2019-12-17 DIAGNOSIS — R21 Rash and other nonspecific skin eruption: Secondary | ICD-10-CM

## 2019-12-17 MED ORDER — POLYSACCHARIDE IRON COMPLEX 150 MG PO CAPS: 150.0000 mg | ORAL_CAPSULE | Freq: Every day | ORAL | 1 refills | Status: DC

## 2019-12-17 MED ORDER — TRIAMCINOLONE ACETONIDE 0.5 % EX CREA: 1.0000 "application " | TOPICAL_CREAM | Freq: Two times a day (BID) | CUTANEOUS | 0 refills | Status: DC

## 2019-12-17 NOTE — Patient Instructions (Addendum)
Thank you for allowing [place] to provide your care today.   Today we discussed skin lesions on your hands, your anemia and blood pressure.  For the skin lesions on your hand, I order triamcinolone cream to be applied twice a day.  Also use moisturizer cream as needed.  Try to use soap for sensitive skin.  The lesion on your hands seem to be dermatitis but I will order blood work to consider other etiology.  And I also refer you to dermatologist for further evaluation.  Blood pressure was elevated today, it improved on second measure but still higher than the goal.  I understand that you might have forgotten taking your pills few times a week and it could have caused elevated blood pressure today.  Please make sure to take your blood pressure medications every day.  I am glad that you have a started doing home exercise, it can help to control your blood pressure in long term too.  I am checking your iron level today and I am prescribing you iron pill since your last blood work showed anemia. Please follow-up in clinic for blood pressure check in 2-3 months or sooner as needed.  I provided note for work for today. Should you have any questions or concerns please call the internal medicine clinic at [phone].

## 2019-12-17 NOTE — Progress Notes (Signed)
Established Patient Office Visit  Subjective:  Patient ID: Martha Garza, female    DOB: Jun 17, 1975  Age: 44 y.o. MRN: [id number]  CC: F/u of rash on her hands Chief Complaint  Patient presents with  . Follow-up    hands    HPI Martha Garza presents for follow-up of rash on her hands.  Please see problem based charting for further details and assessment and plan.   Past medical history: HTN, asthma, rash with pruritus, tobacco use, adverse food reaction, left arm pain  Medications: Albuterol, carvedilol 12.5 mg twice daily, gabapentin, HCTZ 25 mg daily, naproxen, pantoprazole, nicotine patch    Past Medical History:  Diagnosis Date  . Bronchitis   . Hypertension     No past surgical history on file.  Family History  Problem Relation Age of Onset  . Cancer Mother   . Cancer Father   . Cancer Niece     Social History   Socioeconomic History  . Marital status: Married    Spouse name: Not on file  . Number of children: Not on file  . Years of education: Not on file  . Highest education level: Not on file  Occupational History  . Not on file  Tobacco Use  . Smoking status: Current Every Day Smoker    Packs/day: 0.35    Years: 30.00    Pack years: 10.50    Types: Cigarettes  . Smokeless tobacco: Never Used  . Tobacco comment: varies according to situation  Vaping Use  . Vaping Use: Never used  Substance and Sexual Activity  . Alcohol use: Never  . Drug use: Never  . Sexual activity: Not on file  Other Topics Concern  . Not on file  Social History Narrative  . Not on file   Social Determinants of Health   Financial Resource Strain:   . Difficulty of Paying Living Expenses: Not on file  Food Insecurity:   . Worried About Charity fundraiser in the Last Year: Not on file  . Ran Out of Food in the Last Year: Not on file  Transportation Needs:   . Lack of Transportation (Medical): Not on file  . Lack of Transportation (Non-Medical): Not on file    Physical Activity:   . Days of Exercise per Week: Not on file  . Minutes of Exercise per Session: Not on file  Stress:   . Feeling of Stress : Not on file  Social Connections:   . Frequency of Communication with Friends and Family: Not on file  . Frequency of Social Gatherings with Friends and Family: Not on file  . Attends Religious Services: Not on file  . Active Member of Clubs or Organizations: Not on file  . Attends Archivist Meetings: Not on file  . Marital Status: Not on file  Intimate Partner Violence:   . Fear of Current or Ex-Partner: Not on file  . Emotionally Abused: Not on file  . Physically Abused: Not on file  . Sexually Abused: Not on file    Outpatient Medications Prior to Visit  Medication Sig Dispense Refill  . albuterol (PROVENTIL HFA;VENTOLIN HFA) 108 (90 Base) MCG/ACT inhaler Inhale 2-4 puffs into the lungs every 4 (four) hours as needed for wheezing or shortness of breath.     . carvedilol (COREG) 12.5 MG tablet Take 1 tablet (12.5 mg total) by mouth 2 (two) times daily with a meal. 60 tablet 3  . cetirizine (ZYRTEC ALLERGY) 10 MG tablet Take  1 tablet (10 mg total) by mouth daily. 30 tablet 2  . ferrous sulfate 325 (65 FE) MG tablet Take 1 tablet (325 mg total) by mouth 3 (three) times daily with meals. 90 tablet 3  . gabapentin (NEURONTIN) 100 MG capsule Take 1 capsule (100 mg total) by mouth 3 (three) times daily. 90 capsule 2  . hydrochlorothiazide (HYDRODIURIL) 25 MG tablet Take 1 tablet (25 mg total) by mouth daily. 30 tablet 1  . naproxen (NAPROSYN) 500 MG tablet Take 1 tablet (500 mg total) by mouth 2 (two) times daily as needed for moderate pain (menstrual cramps). 20 tablet 0  . nicotine (NICODERM CQ - DOSED IN MG/24 HOURS) 14 mg/24hr patch Place 1 patch (14 mg total) onto the skin daily. 28 patch 0  . pantoprazole (PROTONIX) 40 MG tablet Take 1 tablet (40 mg total) by mouth daily. 30 tablet 0   No facility-administered medications prior to  visit.    Allergies  Allergen Reactions  . Elimite [Permethrin] Hives  . Losartan Hives  . Penicillins Hives    ROS Review of Systems  Constitutional: Negative.   Gastrointestinal: Negative for abdominal pain, diarrhea, nausea and vomiting.  Genitourinary: Negative for dysuria and urgency.  Skin: Positive for itching and rash.    Objective:     BP (!) 151/68   Pulse 69   Temp 98.2 F (36.8 C) (Oral)   Ht 5\' 4"  (1.626 m)   Wt 230 lb 8 oz (104.6 kg)   SpO2 100%   BMI 39.57 kg/m  Wt Readings from Last 3 Encounters:  12/17/19 230 lb 8 oz (104.6 kg)  11/26/19 227 lb 4.8 oz (103.1 kg)  09/13/19 217 lb 3.2 oz (98.5 kg)   Physical Exam Constitutional:      Appearance: Normal appearance.  Cardiovascular:     Rate and Rhythm: Normal rate and regular rhythm.     Heart sounds: No murmur heard.   Pulmonary:     Effort: Pulmonary effort is normal.     Breath sounds: Normal breath sounds. No wheezing or rales.  Skin:    General: Skin is dry.     Findings: Rash present.  Neurological:     Mental Status: She is alert.         Health Maintenance Due  Topic Date Due  . Hepatitis C Screening  Never done  . COVID-19 Vaccine (1) Never done  . TETANUS/TDAP  Never done  . PAP SMEAR-Modifier  Never done  . INFLUENZA VACCINE  Never done    There are no preventive care reminders to display for this patient.  Lab Results  Component Value Date   TSH 2.853 05/16/2019   Lab Results  Component Value Date   WBC 5.3 09/13/2019   HGB 9.3 (L) 09/13/2019   HCT 31.8 (L) 09/13/2019   MCV 74 (L) 09/13/2019   PLT 410 09/13/2019   Lab Results  Component Value Date   NA 143 09/13/2019   K 3.6 09/13/2019   CO2 23 09/13/2019   GLUCOSE 88 09/13/2019   BUN 6 09/13/2019   CREATININE 0.69 09/13/2019   BILITOT 1.4 (H) 05/27/2019   ALKPHOS 45 05/27/2019   AST 13 (L) 05/27/2019   ALT 11 05/27/2019   PROT 7.0 05/27/2019   ALBUMIN 4.1 05/27/2019   CALCIUM 8.7 09/13/2019    ANIONGAP 11 05/27/2019   No results found for: CHOL No results found for: HDL No results found for: LDLCALC No results found for: TRIG No results  found for: CHOLHDL No results found for: HGBA1C    Assessment & Plan:   Problem List Items Addressed This Visit      Cardiovascular and Mediastinum   Accelerated hypertension    Patient was a started on carvedilol 12.5 mg twice daily last visit 11/27/2019 for uncontrolled hypertension despite being on HCTZ 25 mg daily.  BP today is elevated at  155/68. She states that she missed her anti HTN medications few days a week.  Last BMP with normal sodium and potassium and kidney function  -Continue HCTZ 25 mg daily and carvedilol 12.5 mg twice daily -Instructed to be take her anti HTN meds regularly  BMP Latest Ref Rng & Units 09/13/2019 05/27/2019 05/16/2019  Glucose 65 - 99 mg/dL 88 99 88  BUN 6 - 24 mg/dL 6 19 8   Creatinine 0.57 - 1.00 mg/dL 0.69 0.74 0.52  BUN/Creat Ratio 9 - 23 9 - -  Sodium 134 - 144 mmol/L 143 139 139  Potassium 3.5 - 5.2 mmol/L 3.6 3.7 3.8  Chloride 96 - 106 mmol/L 102 103 108  CO2 20 - 29 mmol/L 23 25 24   Calcium 8.7 - 10.2 mg/dL 8.7 9.1 8.9           Musculoskeletal and Integument   Rash and other nonspecific skin eruption - Primary    Patient continues to have popular rash on eczematous base.  Thought to be 2/2 eczema and started on Zyrtec and recommended to continue topical emollients last visit. How ever she only took Zyrtec briefly as she did not get any relief. No other associated symptoms. She used Hydrocort osone cream with some relief. Her palmar rash are not painful and she denies high risk sexual behavior or genitourinary symptoms to think about syphilis, but we will still check RPR. No symptoms suggestive of celiac disease to think of dermatitis herpetiform.  Appears to be dermatitis but as mentioned, will order RPR and also refer to dermatologist for further evaluation.  -Triamcinolone cream  BID -Use soap for sensitive skin -Continue using emollient/moisturizer      Relevant Medications   triamcinolone cream (KENALOG) 0.5 %   Other Relevant Orders   RPR (Completed)   Ambulatory referral to Dermatology     Other   Anemia   Relevant Medications   iron polysaccharides (NU-IRON) 150 MG capsule   Other Relevant Orders   Iron, TIBC and Ferritin Panel (Completed)     Meds ordered this encounter  Medications  . iron polysaccharides (NU-IRON) 150 MG capsule    Sig: Take 1 capsule (150 mg total) by mouth daily.    Dispense:  90 capsule    Refill:  1  . triamcinolone cream (KENALOG) 0.5 %    Sig: Apply 1 application topically 2 (two) times daily.    Dispense:  30 g    Refill:  0    Follow-up: No follow-ups on file.   Patient was discussed with Dr. Evette Doffing. Dewayne Hatch, MD

## 2019-12-18 LAB — IRON,TIBC AND FERRITIN PANEL
Ferritin: 4 ng/mL — ABNORMAL LOW (ref 15–150)
Iron Saturation: 3 % — CL (ref 15–55)
Iron: 11 ug/dL — ABNORMAL LOW (ref 27–159)
Total Iron Binding Capacity: 429 ug/dL (ref 250–450)
UIBC: 418 ug/dL (ref 131–425)

## 2019-12-18 LAB — RPR: RPR Ser Ql: NONREACTIVE

## 2019-12-18 NOTE — Assessment & Plan Note (Addendum)
Patient continues to have popular rash on eczematous base on both palms.  Thought to be 2/2 eczema and started on Zyrtec and recommended to continue topical emollients last visit. How ever she only took Zyrtec briefly as she did not get any relief. No other associated symptoms. She used Hydrocort osone cream with some relief. Her palmar rash are not painful and she denies high risk sexual behavior or genitourinary symptoms to think about syphilis, but we will still check RPR. No symptoms suggestive of celiac disease to think of dermatitis herpetiform.  Appears to be dermatitis but as mentioned, will order RPR and also refer to dermatologist for further evaluation.  -Triamcinolone cream BID -Use soap for sensitive skin -Continue using emollient/moisturizer

## 2019-12-18 NOTE — Assessment & Plan Note (Signed)
Patient was a started on carvedilol 12.5 mg twice daily last visit 11/27/2019 for uncontrolled hypertension despite being on HCTZ 25 mg daily.  BP today is elevated at  155/68. She states that she missed her anti HTN medications few days a week.  Last BMP with normal sodium and potassium and kidney function  -Continue HCTZ 25 mg daily and carvedilol 12.5 mg twice daily -Instructed to be take her anti HTN meds regularly  BMP Latest Ref Rng & Units 09/13/2019 05/27/2019 05/16/2019  Glucose 65 - 99 mg/dL 88 99 88  BUN 6 - 24 mg/dL 6 19 8   Creatinine 0.57 - 1.00 mg/dL 0.69 0.74 0.52  BUN/Creat Ratio 9 - 23 9 - -  Sodium 134 - 144 mmol/L 143 139 139  Potassium 3.5 - 5.2 mmol/L 3.6 3.7 3.8  Chloride 96 - 106 mmol/L 102 103 108  CO2 20 - 29 mmol/L 23 25 24   Calcium 8.7 - 10.2 mg/dL 8.7 9.1 8.9

## 2019-12-18 NOTE — Assessment & Plan Note (Signed)
patient with Hx of IDA and one hospitalization for symptomatic anemia due to vaginal bleeding. She was prescribed Ferrous sulfate but she stopped taking that after she finished prescription.  -Resuming iron supplement with Nu iron -Iron panel for baseline

## 2019-12-20 NOTE — Progress Notes (Signed)
Internal Medicine Clinic Attending  Case discussed with Dr. Masoudi  At the time of the visit.  We reviewed the resident's history and exam and pertinent patient test results.  I agree with the assessment, diagnosis, and plan of care documented in the resident's note.  

## 2020-01-07 ENCOUNTER — Other Ambulatory Visit: Payer: Self-pay | Admitting: Internal Medicine

## 2020-01-07 DIAGNOSIS — I10 Essential (primary) hypertension: Secondary | ICD-10-CM

## 2020-01-07 MED ORDER — HYDROCHLOROTHIAZIDE 25 MG PO TABS: 25.0000 mg | ORAL_TABLET | Freq: Every day | ORAL | 1 refills | Status: DC

## 2020-01-07 NOTE — Telephone Encounter (Signed)
REFILL REQUEST  hydrochlorothiazide (HYDRODIURIL) 25 MG tablet  [facility] [place], [place] Phone:  [phone]  Fax:  [phone]

## 2020-01-14 ENCOUNTER — Other Ambulatory Visit: Payer: Self-pay

## 2020-01-14 DIAGNOSIS — R21 Rash and other nonspecific skin eruption: Secondary | ICD-10-CM

## 2020-01-14 NOTE — Telephone Encounter (Signed)
Recommend re-evaluation in the clinic. Will hold off on steroid treatment until that time.

## 2020-01-14 NOTE — Telephone Encounter (Signed)
Pt is wanting a refill of her triamcinolone cream (KENALOG) 0.5 %  Sent to  [facility], [place] Phone:  [phone]  Fax:  [phone]

## 2020-01-14 NOTE — Telephone Encounter (Signed)
Patient called and notified she will need re-evaluation in clinic.  Appt made for 02/04/20 w/ Dr. Koleen Distance (patient chose this date). SChaplin, RN,BSN

## 2020-01-28 ENCOUNTER — Telehealth: Payer: Self-pay

## 2020-01-28 NOTE — Telephone Encounter (Signed)
Pt  Is requesting a call back  due to the cream that was given for her hand has stopped working  And she does not know what to do

## 2020-01-28 NOTE — Telephone Encounter (Signed)
She will need an in person appointment for evaluation of rash.  Please see if anything is available tomorrow or early next week.

## 2020-02-03 ENCOUNTER — Ambulatory Visit (INDEPENDENT_AMBULATORY_CARE_PROVIDER_SITE_OTHER): Payer: PRIVATE HEALTH INSURANCE | Admitting: Internal Medicine

## 2020-02-03 ENCOUNTER — Other Ambulatory Visit: Payer: Self-pay

## 2020-02-03 VITALS — BP 152/62 | HR 72 | Wt 244.8 lb

## 2020-02-03 DIAGNOSIS — F4320 Adjustment disorder, unspecified: Secondary | ICD-10-CM | POA: Diagnosis not present

## 2020-02-03 DIAGNOSIS — R21 Rash and other nonspecific skin eruption: Secondary | ICD-10-CM

## 2020-02-03 MED ORDER — CLOBETASOL PROPIONATE 0.05 % EX OINT: 1.0000 "application " | TOPICAL_OINTMENT | Freq: Two times a day (BID) | CUTANEOUS | 0 refills | Status: DC

## 2020-02-03 NOTE — Patient Instructions (Signed)
Martha Garza, Today we discussed your rash.   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 This actually requires a stronger steroid cream than what you had previously tried.   I will send this in while we wait on your referral to dermatology.   I have also placed a referral for counseling services as requested.   Take care,  Dr. Chesley Mires

## 2020-02-04 ENCOUNTER — Encounter: Payer: Self-pay | Admitting: Internal Medicine

## 2020-02-04 ENCOUNTER — Encounter: Payer: PRIVATE HEALTH INSURANCE | Admitting: Internal Medicine

## 2020-02-04 DIAGNOSIS — F4320 Adjustment disorder, unspecified: Secondary | ICD-10-CM | POA: Insufficient documentation

## 2020-02-04 NOTE — Progress Notes (Signed)
Acute Office Visit  Subjective:    Patient ID: Annisha Muhlbach, female    DOB: January 27, 1976, 45 y.o.   MRN: [id number]  Chief complaint: hand rash    HPI Patient is in today for follow-up on bilateral hand rash associated with severe pruritis. This has been going on for the last 2-3 months. She has been evaluated in clinic and was prescribed triamcinolone cream which she said helped somewhat initially, but now it appears worse than ever before. Symptoms worsen any times she washes her hands. She ran out of the Triamcinolone cream and has been using cortisone cream she had at home. Symptoms have become increasingly distressing to her. She is having difficulty sleeping at night due to severe pruritis. No other systemic symptoms. She mentions her blood pressure is elevated because she has not been taking her medication in case this was contributing to her rash.   Past Medical History:  Diagnosis Date  . Bronchitis   . Hypertension     History reviewed. No pertinent surgical history.  Family History  Problem Relation Age of Onset  . Cancer Mother   . Cancer Father   . Cancer Niece     Social History   Socioeconomic History  . Marital status: Married    Spouse name: Not on file  . Number of children: Not on file  . Years of education: Not on file  . Highest education level: Not on file  Occupational History  . Not on file  Tobacco Use  . Smoking status: Current Every Day Smoker    Packs/day: 0.35    Years: 30.00    Pack years: 10.50    Types: Cigarettes  . Smokeless tobacco: Never Used  . Tobacco comment: varies according to situation  Vaping Use  . Vaping Use: Never used  Substance and Sexual Activity  . Alcohol use: Never  . Drug use: Never  . Sexual activity: Not on file  Other Topics Concern  . Not on file  Social History Narrative  . Not on file   Social Determinants of Health   Financial Resource Strain: Not on file  Food Insecurity: Not on file   Transportation Needs: Not on file  Physical Activity: Not on file  Stress: Not on file  Social Connections: Not on file  Intimate Partner Violence: Not on file    Outpatient Medications Prior to Visit  Medication Sig Dispense Refill  . albuterol (PROVENTIL HFA;VENTOLIN HFA) 108 (90 Base) MCG/ACT inhaler Inhale 2-4 puffs into the lungs every 4 (four) hours as needed for wheezing or shortness of breath.     . carvedilol (COREG) 12.5 MG tablet Take 1 tablet (12.5 mg total) by mouth 2 (two) times daily with a meal. 60 tablet 3  . cetirizine (ZYRTEC ALLERGY) 10 MG tablet Take 1 tablet (10 mg total) by mouth daily. 30 tablet 2  . ferrous sulfate 325 (65 FE) MG tablet Take 1 tablet (325 mg total) by mouth 3 (three) times daily with meals. 90 tablet 3  . gabapentin (NEURONTIN) 100 MG capsule Take 1 capsule (100 mg total) by mouth 3 (three) times daily. 90 capsule 2  . hydrochlorothiazide (HYDRODIURIL) 25 MG tablet Take 1 tablet (25 mg total) by mouth daily. 30 tablet 1  . iron polysaccharides (NU-IRON) 150 MG capsule Take 1 capsule (150 mg total) by mouth daily. 90 capsule 1  . naproxen (NAPROSYN) 500 MG tablet Take 1 tablet (500 mg total) by mouth 2 (two) times daily as needed  for moderate pain (menstrual cramps). 20 tablet 0  . nicotine (NICODERM CQ - DOSED IN MG/24 HOURS) 14 mg/24hr patch Place 1 patch (14 mg total) onto the skin daily. 28 patch 0  . pantoprazole (PROTONIX) 40 MG tablet Take 1 tablet (40 mg total) by mouth daily. 30 tablet 0  . triamcinolone cream (KENALOG) 0.5 % Apply 1 application topically 2 (two) times daily. 30 g 0   No facility-administered medications prior to visit.    Allergies  Allergen Reactions  . Elimite [Permethrin] Hives  . Losartan Hives  . Penicillins Hives    Review of Systems  Constitutional: Negative for chills and fever.  Musculoskeletal: Negative for arthralgias.  Skin: Positive for color change and rash.  Allergic/Immunologic: Negative for  environmental allergies and food allergies.       Objective:    Physical Exam Constitutional:      General: She is not in acute distress.    Appearance: Normal appearance.  Skin:    Comments: See photos in media tab. Palmar rash bilaterally that is papular with erythematous base. Left hand is more severe than right.   Neurological:     Mental Status: She is alert.     BP (!) 152/62 (BP Location: Left Arm, Patient Position: Sitting, Cuff Size: Normal)   Pulse 72   Wt 244 lb 12.8 oz (111 kg)   SpO2 100%   BMI 42.02 kg/m  Wt Readings from Last 3 Encounters:  02/03/20 244 lb 12.8 oz (111 kg)  12/17/19 230 lb 8 oz (104.6 kg)  11/26/19 227 lb 4.8 oz (103.1 kg)    Health Maintenance Due  Topic Date Due  . Hepatitis C Screening  Never done  . COVID-19 Vaccine (1) Never done  . TETANUS/TDAP  Never done  . PAP SMEAR-Modifier  Never done  . INFLUENZA VACCINE  Never done    There are no preventive care reminders to display for this patient.   Lab Results  Component Value Date   TSH 2.853 05/16/2019   Lab Results  Component Value Date   WBC 5.3 09/13/2019   HGB 9.3 (L) 09/13/2019   HCT 31.8 (L) 09/13/2019   MCV 74 (L) 09/13/2019   PLT 410 09/13/2019   Lab Results  Component Value Date   NA 143 09/13/2019   K 3.6 09/13/2019   CO2 23 09/13/2019   GLUCOSE 88 09/13/2019   BUN 6 09/13/2019   CREATININE 0.69 09/13/2019   BILITOT 1.4 (H) 05/27/2019   ALKPHOS 45 05/27/2019   AST 13 (L) 05/27/2019   ALT 11 05/27/2019   PROT 7.0 05/27/2019   ALBUMIN 4.1 05/27/2019   CALCIUM 8.7 09/13/2019   ANIONGAP 11 05/27/2019   No results found for: CHOL No results found for: HDL No results found for: LDLCALC No results found for: TRIG No results found for: CHOLHDL No results found for: HGBA1C     Assessment & Plan:   Problem List Items Addressed This Visit      Musculoskeletal and Integument   Rash and other nonspecific skin eruption    Patient's clinic picture  consistent with dyshidrotic eczema. Her prescribed steroid cream was likely not potent enough. Will try clobetasol ointment twice daily. She was also encouraged to be vigilant with emollients to keep hands moisturized. Derm referral had been placed at previous visit on 11/17 and is still in process.         Other   Adjustment disorder - Primary    Patient did  not go into much detail, but mentions she has a lot of life stressors and requests to be referred to counseling services. Referral to in-house behavioral health placed at today's visit.       Relevant Orders   Ambulatory referral to Integrated Behavioral Health       Meds ordered this encounter  Medications  . clobetasol ointment (TEMOVATE) 0.05 %    Sig: Apply 1 application topically 2 (two) times daily.    Dispense:  30 g    Refill:  0     Raylan Troiani D Peaches Vanoverbeke, DO

## 2020-02-04 NOTE — Assessment & Plan Note (Signed)
Patient did not go into much detail, but mentions she has a lot of life stressors and requests to be referred to counseling services. Referral to in-house behavioral health placed at today's visit.

## 2020-02-04 NOTE — Assessment & Plan Note (Signed)
Patient's clinic picture consistent with dyshidrotic eczema. Her prescribed steroid cream was likely not potent enough. Will try clobetasol ointment twice daily. She was also encouraged to be vigilant with emollients to keep hands moisturized. Derm referral had been placed at previous visit on 11/17 and is still in process.

## 2020-02-05 NOTE — Progress Notes (Signed)
[facility] Attending  Case discussed with Dr. Chesley Mires  At the time of the visit.  We reviewed the resident's history and exam and pertinent patient test results, and I reviewed photographs of and lesions.  I agree with the assessment, diagnosis, and plan of care documented in the resident's note.

## 2020-02-12 ENCOUNTER — Encounter: Payer: Self-pay | Admitting: Internal Medicine

## 2020-02-19 ENCOUNTER — Telehealth: Payer: Self-pay | Admitting: Behavioral Health

## 2020-02-19 ENCOUNTER — Institutional Professional Consult (permissible substitution): Payer: PRIVATE HEALTH INSURANCE | Admitting: Behavioral Health

## 2020-02-19 ENCOUNTER — Other Ambulatory Visit: Payer: Self-pay

## 2020-02-19 NOTE — Telephone Encounter (Signed)
Contacted Pt three times this morning from 10:00am-10:20am for scheduled session. Lft msg ea time & asked Pt on last msg to r/s at her convenience for another time.  Dr. Kerin Salen

## 2020-02-25 ENCOUNTER — Encounter: Payer: Self-pay | Admitting: Internal Medicine

## 2020-03-11 ENCOUNTER — Institutional Professional Consult (permissible substitution): Payer: PRIVATE HEALTH INSURANCE | Admitting: Behavioral Health

## 2020-03-11 ENCOUNTER — Other Ambulatory Visit: Payer: Self-pay

## 2020-03-17 ENCOUNTER — Ambulatory Visit (INDEPENDENT_AMBULATORY_CARE_PROVIDER_SITE_OTHER): Payer: BLUE CROSS/BLUE SHIELD | Admitting: Internal Medicine

## 2020-03-17 ENCOUNTER — Other Ambulatory Visit: Payer: Self-pay

## 2020-03-17 ENCOUNTER — Encounter: Payer: Self-pay | Admitting: Internal Medicine

## 2020-03-17 VITALS — BP 170/100 | HR 71 | Temp 98.7°F | Ht 66.0 in | Wt 234.0 lb

## 2020-03-17 DIAGNOSIS — M79602 Pain in left arm: Secondary | ICD-10-CM

## 2020-03-17 DIAGNOSIS — Z716 Tobacco abuse counseling: Secondary | ICD-10-CM

## 2020-03-17 DIAGNOSIS — R21 Rash and other nonspecific skin eruption: Secondary | ICD-10-CM

## 2020-03-17 DIAGNOSIS — I10 Essential (primary) hypertension: Secondary | ICD-10-CM

## 2020-03-17 MED ORDER — NICOTINE POLACRILEX 2 MG MT GUM: 2.0000 mg | CHEWING_GUM | OROMUCOSAL | 0 refills | Status: DC | PRN

## 2020-03-17 MED ORDER — AMLODIPINE BESYLATE 10 MG PO TABS: 10.0000 mg | ORAL_TABLET | Freq: Every day | ORAL | 3 refills | Status: DC

## 2020-03-17 MED ORDER — TRIAMCINOLONE ACETONIDE 0.5 % EX CREA
1.0000 | TOPICAL_CREAM | Freq: Two times a day (BID) | CUTANEOUS | 2 refills | Status: DC
Start: 2020-03-17 — End: 2020-04-03

## 2020-03-17 NOTE — Patient Instructions (Signed)
Thank you for allowing [place] to provide your care today. Today we discussed your rash and blood pressure and smoke cessation.  I send refill for Triamcinolone cream to be used as needed for skin rash until you will be seen by dermatologist. (Referral for dermatologist sent before and will be processed now that you have insurance). Someone from dermatology office will call you for the apointment. Your blood pressure was high today. I understand that you stopped your blood pressure medications because you felt they made the rash worse. As you are not willing to restart HCTZ and Carvedilol I go ahead and start a new medication (Amlodipine) for your blood pressure. Please take 1 tablet (10 mg) once a day.  Please come back to clinic in 4-6 weeks for f/u of blood pressure.  Should you have any questions or concerns please call the internal medicine clinic at [phone].    Thank you!

## 2020-03-17 NOTE — Progress Notes (Signed)
Established Patient Office Visit  Subjective:  Patient ID: Martha Garza, female    DOB: 1975-09-09  Age: 45 y.o. MRN: [id number]  CC: F/u of  rash on the hands   HPIwith PMHx as documented below, presented for f/u of  rash on the hands. Please refer to problem based charting for further details and assessment and plan of current problem and chronic medical conditions.  Past medical history: HTN, asthma, rash with pruritus, tobacco use, adverse food reaction, left arm pain  Medications: clobetasol, Albuterol, pantoprazole, NU iron, gabapentin  Does not take: Carvedilol 12.5 mg twice daily, and HCTZ 25 mg daily because of worsening of her rash.   Past Medical History:  Diagnosis Date  . Bronchitis   . Hypertension     No past surgical history on file.  Family History  Problem Relation Age of Onset  . Cancer Mother   . Cancer Father   . Cancer Niece     Social History   Socioeconomic History  . Marital status: Married    Spouse name: Not on file  . Number of children: Not on file  . Years of education: Not on file  . Highest education level: Not on file  Occupational History  . Not on file  Tobacco Use  . Smoking status: Current Every Day Smoker    Packs/day: 0.35    Years: 30.00    Pack years: 10.50    Types: Cigarettes  . Smokeless tobacco: Never Used  . Tobacco comment: varies according to situation  Vaping Use  . Vaping Use: Never used  Substance and Sexual Activity  . Alcohol use: Never  . Drug use: Never  . Sexual activity: Not on file  Other Topics Concern  . Not on file  Social History Narrative  . Not on file   Social Determinants of Health   Financial Resource Strain: Not on file  Food Insecurity: Not on file  Transportation Needs: Not on file  Physical Activity: Not on file  Stress: Not on file  Social Connections: Not on file  Intimate Partner Violence: Not on file    Outpatient Medications Prior to Visit  Medication Sig Dispense  Refill  . albuterol (PROVENTIL HFA;VENTOLIN HFA) 108 (90 Base) MCG/ACT inhaler Inhale 2-4 puffs into the lungs every 4 (four) hours as needed for wheezing or shortness of breath.     . carvedilol (COREG) 12.5 MG tablet Take 1 tablet (12.5 mg total) by mouth 2 (two) times daily with a meal. 60 tablet 3  . cetirizine (ZYRTEC ALLERGY) 10 MG tablet Take 1 tablet (10 mg total) by mouth daily. 30 tablet 2  . ferrous sulfate 325 (65 FE) MG tablet Take 1 tablet (325 mg total) by mouth 3 (three) times daily with meals. 90 tablet 3  . gabapentin (NEURONTIN) 100 MG capsule Take 1 capsule (100 mg total) by mouth 3 (three) times daily. 90 capsule 2  . hydrochlorothiazide (HYDRODIURIL) 25 MG tablet Take 1 tablet (25 mg total) by mouth daily. 30 tablet 1  . iron polysaccharides (NU-IRON) 150 MG capsule Take 1 capsule (150 mg total) by mouth daily. 90 capsule 1  . naproxen (NAPROSYN) 500 MG tablet Take 1 tablet (500 mg total) by mouth 2 (two) times daily as needed for moderate pain (menstrual cramps). 20 tablet 0  . pantoprazole (PROTONIX) 40 MG tablet Take 1 tablet (40 mg total) by mouth daily. 30 tablet 0  . clobetasol ointment (TEMOVATE) 0.25 % Apply 1 application topically 2 (  two) times daily. 30 g 0  . nicotine (NICODERM CQ - DOSED IN MG/24 HOURS) 14 mg/24hr patch Place 1 patch (14 mg total) onto the skin daily. 28 patch 0  . triamcinolone cream (KENALOG) 0.5 % Apply 1 application topically 2 (two) times daily. 30 g 0   No facility-administered medications prior to visit.    Allergies  Allergen Reactions  . Elimite [Permethrin] Hives  . Losartan Hives  . Penicillins Hives    ROS Review of Systems    Objective:     BP (!) 170/100 (BP Location: Right Wrist, Patient Position: Sitting, Cuff Size: Small)   Pulse 71   Temp 98.7 F (37.1 C) (Oral)   Ht 5\' 6"  (1.676 m)   Wt 234 lb (106.1 kg)   SpO2 99%   BMI 37.77 kg/m  Wt Readings from Last 3 Encounters:  03/17/20 234 lb (106.1 kg)  02/03/20  244 lb 12.8 oz (111 kg)  12/17/19 230 lb 8 oz (104.6 kg)   Physical Exam Constitutional:      Appearance: She is obese.  HENT:     Head: Normocephalic and atraumatic.  Cardiovascular:       Heart sounds: Normal heart sounds. No murmur heard.   Abdominal:     Palpations: Abdomen is soft.     Tenderness: There is no abdominal tenderness.  Skin:    Findings: Erythema and rash present.  Neurological:     General: No focal deficit present.     Mental Status: She is oriented to person, place, and time.  Psychiatric:        Mood and Affect: Mood normal.        Behavior: Behavior normal.        Thought Content: Thought content normal.        Judgment: Judgment normal.   Health Maintenance Due  Topic Date Due  . Hepatitis C Screening  Never done  . COVID-19 Vaccine (1) Never done  . TETANUS/TDAP  Never done  . PAP SMEAR-Modifier  Never done  . INFLUENZA VACCINE  Never done    There are no preventive care reminders to display for this patient.  Lab Results  Component Value Date   TSH 2.853 05/16/2019   Lab Results  Component Value Date   WBC 5.3 09/13/2019   HGB 9.3 (L) 09/13/2019   HCT 31.8 (L) 09/13/2019   MCV 74 (L) 09/13/2019   PLT 410 09/13/2019   Lab Results  Component Value Date   NA 143 09/13/2019   K 3.6 09/13/2019   CO2 23 09/13/2019   GLUCOSE 88 09/13/2019   BUN 6 09/13/2019   CREATININE 0.69 09/13/2019   BILITOT 1.4 (H) 05/27/2019   ALKPHOS 45 05/27/2019   AST 13 (L) 05/27/2019   ALT 11 05/27/2019   PROT 7.0 05/27/2019   ALBUMIN 4.1 05/27/2019   CALCIUM 8.7 09/13/2019   ANIONGAP 11 05/27/2019   No results found for: CHOL No results found for: HDL No results found for: LDLCALC No results found for: TRIG No results found for: CHOLHDL No results found for: HGBA1C    Assessment & Plan:   Problem List Items Addressed This Visit      Cardiovascular and Mediastinum   Accelerated hypertension    BP is elevated at 170/100. No symptoms. Last  BMP 09/13/2019 was normal (with creatinine 0.69, potassium 3.6, sodium 143.) Patient has not taken her Coreg and HCTZ for several week because she thinks they made her skin rash worse.  -  Will switch he meds to Amlodipine 10 mg QD. (Will add medications one by one given her concern about allergy to medications)         Relevant Medications   amLODipine (NORVASC) 10 MG tablet     Musculoskeletal and Integument   Rash and other nonspecific skin eruption    Patient was first seen in clinic on 12/07/2019 for rash on her hands.  Started on Triamcinolone for exzema. RPR was also checked and was negative. referal to dermatology placed then. Initially responded well to that and then reoccurred. She thinks that her antihypertensive medications made the rash worse so she discontinued them. She was seen in [facility] again on 02/03/20 when Triamcinolone switched to clobetasol. She reports some improvement with that but has ran out of the Clobetasol. 02/03/2020. Awaiting derm appointment.  She has some rash on erythematous bases with, with thin skin on her palms.  Will continue moderate potency steroid (will avoid long term clobetasol given thin skin) and to follow up with dermatologist. Ms. Patrick North is working on referral.  -Sending refill for triamcinolon -F/u with dermatologist        Relevant Medications   triamcinolone cream (KENALOG) 0.5 %     Other   Left arm pain    Improved with gabapentin. Denies any pain       Other Visit Diagnoses    Encounter for smoking cessation counseling    -  Primary   Relevant Medications   nicotine polacrilex (NICORETTE) 2 MG gum      Meds ordered this encounter  Medications  . nicotine polacrilex (NICORETTE) 2 MG gum    Sig: Take 1 each (2 mg total) by mouth as needed for smoking cessation.    Dispense:  100 each    Refill:  0  . amLODipine (NORVASC) 10 MG tablet    Sig: Take 1 tablet (10 mg total) by mouth daily.    Dispense:  30 tablet    Refill:  3   . triamcinolone cream (KENALOG) 0.5 %    Sig: Apply 1 application topically 2 (two) times daily.    Dispense:  30 g    Refill:  2    Follow-up: No follow-ups on file.    Dewayne Hatch, MD

## 2020-03-19 NOTE — Progress Notes (Signed)
Internal Medicine Clinic Attending  Case discussed with Dr. Masoudi  At the time of the visit.  We reviewed the resident's history and exam and pertinent patient test results.  I agree with the assessment, diagnosis, and plan of care documented in the resident's note.  

## 2020-03-20 ENCOUNTER — Encounter: Payer: Self-pay | Admitting: Internal Medicine

## 2020-03-20 NOTE — Assessment & Plan Note (Signed)
Patient was first seen in clinic on 12/07/2019 for rash on her hands.  Started on Triamcinolone for exzema. RPR was also checked and was negative. referal to dermatology placed then. Initially responded well to that and then reoccurred. She thinks that her antihypertensive medications made the rash worse so she discontinued them. She was seen in [facility] again on 02/03/20 when Triamcinolone switched to clobetasol. She reports some improvement with that but has ran out of the Clobetasol. 02/03/2020. Awaiting derm appointment.  She has some rash on erythematous bases with, with thin skin on her palms.  Will continue moderate potency steroid (will avoid long term clobetasol given thin skin) and to follow up with dermatologist. Ms. Patrick North is working on referral.  -Sending refill for triamcinolon -F/u with dermatologist

## 2020-03-20 NOTE — Assessment & Plan Note (Signed)
BP is elevated at 170/100. No symptoms. Last BMP 09/13/2019 was normal (with creatinine 0.69, potassium 3.6, sodium 143.) Patient has not taken her Coreg and HCTZ for several week because she thinks they made her skin rash worse.  -Will switch he meds to Amlodipine 10 mg QD. (Will add medications one by one given her concern about allergy to medications)

## 2020-03-20 NOTE — Assessment & Plan Note (Signed)
Improved with gabapentin. Denies any pain

## 2020-03-20 NOTE — Assessment & Plan Note (Signed)
Reports compliance to Nu Iron. (Starte last visit due to lab finding on 12/17/2019: Iron saturation 3, Ferritin 4, iron 11, TIBC 429.)  -Continue iron supplements -repeat  Iron panel next visit

## 2020-04-03 ENCOUNTER — Encounter: Payer: Self-pay | Admitting: Student

## 2020-04-03 ENCOUNTER — Ambulatory Visit (INDEPENDENT_AMBULATORY_CARE_PROVIDER_SITE_OTHER): Payer: BLUE CROSS/BLUE SHIELD | Admitting: Student

## 2020-04-03 DIAGNOSIS — R21 Rash and other nonspecific skin eruption: Secondary | ICD-10-CM | POA: Diagnosis not present

## 2020-04-03 DIAGNOSIS — I10 Essential (primary) hypertension: Secondary | ICD-10-CM

## 2020-04-03 DIAGNOSIS — R519 Headache, unspecified: Secondary | ICD-10-CM | POA: Diagnosis not present

## 2020-04-03 MED ORDER — NAPROXEN 500 MG PO TABS: 500.0000 mg | ORAL_TABLET | Freq: Two times a day (BID) | ORAL | 0 refills | Status: DC | PRN

## 2020-04-03 MED ORDER — TERBINAFINE HCL 1 % EX CREA: 1.0000 "application " | TOPICAL_CREAM | Freq: Two times a day (BID) | CUTANEOUS | 0 refills | Status: DC

## 2020-04-03 NOTE — Assessment & Plan Note (Signed)
Patient reports she has been using topical triamcinolone on the palms of her hands, but the rash is not improving and may be worsening.  States she thinks the rash is due to her eczema.  Notes that it is often itchy, however it is not painful unless she stops moisturizing.  Notes that her palm skin has thinned out.  A/P: See media tab in this note for updated picture of rash.  Upon further inspection, given the palmar location of the rash and that topical steroids have not improved the rash, suspect this rash may be due to tinea manus as opposed to eczema.  Will trial a topical antifungal, and the patient is scheduled for an appointment with dermatology on 04/17/2020. - Stop topical triamcinolone - Start terbinafine 1% cream 1 application 2 times daily - Patient scheduled for dermatology visit on 04/17/2020

## 2020-04-03 NOTE — Progress Notes (Addendum)
   CC: headache  HPI:  [name] is a 45 y.o. woman with history of HTN, asthma, eczema, tobacco use who presents to clinic for HTN and rash follow-up and evaluation of headaches. Her last clinic visit was on 03/17/20 with Dr. Myrtie Hawk.   To see the details of this patient's management of their acute and chronic problems, please refer to the Assessment & Plan under the Encounters tab.    Past Medical History:  Diagnosis Date  . Bronchitis   . Hypertension    Review of Systems:    Review of Systems  Constitutional: Negative for chills and fever.  HENT: Negative for congestion.   Eyes: Positive for photophobia. Negative for blurred vision, double vision, pain, discharge and redness.  Skin: Positive for rash.  Neurological: Positive for headaches. Negative for speech change, focal weakness and weakness.    Physical Exam:  Vitals:   04/03/20 0935  BP: 137/78  Pulse: 67  SpO2: 100%  Weight: 233 lb 12.8 oz (106.1 kg)   Constitutional: well-appearing woman sitting in chair, in no acute distress HENT: normocephalic atraumatic, mucous membranes moist; no scalp tenderness Eyes: conjunctiva non-erythematous, PERRL Neck: supple Cardiovascular: regular rate and rhythm, no m/r/g Pulmonary/Chest: normal work of breathing on room air, lungs clear to auscultation bilaterally Abdominal: soft, non-tender, non-distended MSK: normal bulk and tone Neurological: alert & oriented x 3, normal gait Skin: warm and dry; scaly, erythematous rash on palmar surface of bilateral hands as below Psych: normal mood and affect      Assessment & Plan:   See Encounters Tab for problem based charting.  Patient discussed with Dr. Rebeca Alert

## 2020-04-03 NOTE — Patient Instructions (Addendum)
Ms. Kriegel,   Thank you for your visit to the [facility] today. It was a pleasure meeting you. Today we discussed the following:  1) Hypertension: your BP was much improved today! Continue the amlodipine 10 mg daily.  2) Headache: I'm glad your headaches have improved with naproxen. I have sent in another prescription. Please call back if your headache stops responding or you are requiring daily use of naproxen. Stay hydrated and stay active!  3) Hand rash: I would like to try you on a different hand cream. I have sent a prescription for terbinafine 1% cream. Stop using the steroid cream. Call back if your rash gets worse on this. I'm glad you have follow-up with dermatology on 04/17/20.    We would like to see you back at the end of the month for your previously scheduled appointment. Please bring all of your medications with you.   If you have any questions or concerns, please call our clinic at [phone] between 9am-5pm. Outside of these hours, call [phone] and ask for the internal medicine resident on call. If you feel you are having a medical emergency please call 911.

## 2020-04-03 NOTE — Assessment & Plan Note (Signed)
Patient reports last Thursday she had acute onset of stabbing pain on the right side of her head.  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  Reports the pain lasts 2 to 3 minutes at a time then will improve for a few minutes and then restart, and occur daily on and off through Sunday.  Associated with photophobia.  Improved slightly with rest in a dark room and bending her head below her waist. Denies concomitant eye pain, blurry vision, weakness, preceding or prodromal symptoms.  No associated lacrimation or rhinorrhea.  Reports she had similar headaches about 7 years ago.  Notes that prior to Thursday she was on a CABG weight loss diet.  The pain finally eased with naproxen which she had left over from a few years ago.  On exam, PERRL, no scalp or face tenderness.   A/P: Reassuringly, no red flag symptoms. No associated autonomic symptoms, making short-lasting unilateral neuralgiform headache attacks with conjunctival injection and tearing (SUNCT), short-lasting unilateral neuralgiform headache attacks with cranial autonomic symptoms (SUNA), or cluster headaches less likely.  Features are not consistent with tension or migraine headache.  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 - Refilled naproxen 500 mg twice daily as needed - Counseled patient to get adequate rest, stay hydrated -Instructed patient to call or return to clinic if she finds herself taking naproxen daily for the headaches no longer respond

## 2020-04-03 NOTE — Assessment & Plan Note (Signed)
BP improved to 137/78 today.  She reports she has been taking the amlodipine 10 mg daily as prescribed at her last visit on 03/20/2020.  Denies blurry vision.  Endorses headache as discussed in problem based assessment.  States she ordered a wrist cuff for home measurements.  Reports she has been eating 3 stocks of celery daily as recommended to her by a client.  A/P: Patient's blood pressure is fairly well controlled on current regimen of amlodipine 10 mg daily.  We will have her continue this regimen for now and measure blood pressure at home.  Instructed her to bring her cuff in at her next visit so that we can compare readings to the clinic blood pressure cuff. - Continue amlodipine 10 mg daily - Bring home BP cuff at next visit -

## 2020-04-07 NOTE — Progress Notes (Signed)
Internal Medicine Clinic Attending  Case discussed with Dr. Watson at the time of the visit.  We reviewed the resident's history and exam and pertinent patient test results.  I agree with the assessment, diagnosis, and plan of care documented in the resident's note.  Liza Czerwinski, M.D., Ph.D.  

## 2020-04-27 NOTE — Progress Notes (Signed)
   CC: rash on hands, anemia, HTN  HPI:  Martha Garza is a 45 y.o. woman with history of HTN, asthma, eczema who presents to clinic for 3-week follow-up of rash on hands, HTN, and anemia. Her last clinic visit was on 04/03/20.   To see the details of this patient's management of their acute and chronic problems, please refer to the Assessment & Plan under the Encounters tab.    Past Medical History:  Diagnosis Date  . Bronchitis   . Hypertension    Review of Systems:    Review of Systems  Constitutional: Positive for malaise/fatigue. Negative for chills and fever.  HENT: Negative for congestion.   Eyes: Negative for blurred vision.  Respiratory: Negative for shortness of breath.   Cardiovascular: Negative for chest pain, palpitations and leg swelling.  Gastrointestinal: Negative for abdominal pain, blood in stool, nausea and vomiting.  Genitourinary: Negative for hematuria.  Musculoskeletal: Negative for myalgias.  Skin: Positive for rash. Negative for itching.  Neurological: Negative for dizziness, focal weakness, weakness and headaches.  Psychiatric/Behavioral: Negative for depression.    Physical Exam:  Vitals:   04/28/20 1045 04/28/20 1125  BP: (!) 152/87 (!) 142/88  Pulse: 78 67  Temp: 98.3 F (36.8 C)   TempSrc: Oral   SpO2: 100%   Weight: 238 lb 3.2 oz (108 kg)   Height: 5\' 6"  (1.676 m)    Constitutional: well-appearing woman sitting in chair, in no acute distress HENT: normocephalic atraumatic, mucous membranes moist, sublingual pallor Eyes: conjunctiva non-erythematous Neck: supple Cardiovascular: regular rate and rhythm, no m/r/g; no lower extremity edema Pulmonary/Chest: normal work of breathing on room air, lungs clear to auscultation bilaterally Abdominal: soft, non-distended MSK: normal bulk and tone Neurological: alert & oriented x 3, normal gait Skin: warm and dry; significantly improved (less scale and erythema) rash on palmar surface of hands  as below Psych: normal mood and affect    Assessment & Plan:   See Encounters Tab for problem-based charting.  Patient discussed with Dr. Heber [place]

## 2020-04-27 NOTE — Patient Instructions (Addendum)
Ms.Tanganyika Mckee,   Thank you for your visit to the [facility] today. It was a pleasure seeing you again. Today we discussed the following:  1) Iron deficiency anemia  - Continue your oral iron supplementation - I have placed a referral to gynecology for you to be further evaluated for heavy menstrual bleeding, which I suspect is the source of your iron deficiency  2) Eczema, psoriasis - I'm glad your rash is improving! Keep taking the medications as prescribed by dermatology and follow-up with them as instrcuted.  3) Hypertension - Continue monitoring your blood pressure at home.  - Keep taking your amlodipine daily. - Your goal BP is <130/80   We would like to see you back in 67-month for follow-up of your anemia and blood pressure. Ideally you would have seen gynecology prior to that appointment. Please bring all of your medications with you.   If you have any questions or concerns, please call our clinic at [phone] between 9am-5pm. Outside of these hours, call [phone] and ask for the internal medicine resident on call. If you feel you are having a medical emergency please call 911.

## 2020-04-28 ENCOUNTER — Encounter: Payer: BLUE CROSS/BLUE SHIELD | Admitting: Student

## 2020-04-28 ENCOUNTER — Other Ambulatory Visit: Payer: Self-pay

## 2020-04-28 ENCOUNTER — Ambulatory Visit (INDEPENDENT_AMBULATORY_CARE_PROVIDER_SITE_OTHER): Payer: BLUE CROSS/BLUE SHIELD | Admitting: Student

## 2020-04-28 ENCOUNTER — Encounter: Payer: Self-pay | Admitting: Student

## 2020-04-28 VITALS — BP 142/88 | HR 67 | Temp 98.3°F | Ht 66.0 in | Wt 238.2 lb

## 2020-04-28 DIAGNOSIS — I10 Essential (primary) hypertension: Secondary | ICD-10-CM | POA: Diagnosis not present

## 2020-04-28 DIAGNOSIS — L409 Psoriasis, unspecified: Secondary | ICD-10-CM

## 2020-04-28 DIAGNOSIS — D500 Iron deficiency anemia secondary to blood loss (chronic): Secondary | ICD-10-CM | POA: Diagnosis not present

## 2020-04-28 DIAGNOSIS — R519 Headache, unspecified: Secondary | ICD-10-CM

## 2020-04-28 DIAGNOSIS — D509 Iron deficiency anemia, unspecified: Secondary | ICD-10-CM | POA: Diagnosis not present

## 2020-04-28 NOTE — Assessment & Plan Note (Addendum)
During recent evaluation by dermatology for rash on hands and legs, CBC was obtained and showed H&H of 8.1&26.6 with MCV of 61.9. Patient reports adherence to Nu-Iron 150 mg daily. She endorses fatigue and occasionally lightheadedness. Per patient and chart review, she had an admission in 05/2019 for symptomatic iron deficiency anemia suspected to be secondary to menorrhagia. Per chart review of discharge summary from that admission, she was advised to follow-up with gynecology. Today, she reports she has not seen gynecology since admission. Reports her menstrual cycles are regular, last 5 days with two lighter flow days, one heavy flow day, and finally two lighter flow days. This is compared to prior to admission menstrual periods lasting 7 days with mostly heavy flow. She uses super absorbent pads, and on heavy flow days changes her pad 6-7 times. She is not currently on hormonal birth control, has never had or heard of a IUD.   A/P: Martha Garza is still iron deficient despite daily oral iron supplementation. 09/13/19 H&H 9.3&31.8 with MCV 74 --> 12/17/19 iron 11 ferritin 4 iron sat 3 --> 04/17/20 H&H 8.1&26.6 with MCV of 61.9. She is overall well-appearing and hemodynamically stable, however does endorse fatigue and occasional dizziness. She denies history of Celiac disease, and I am not aware of any reason she should have poor GI absorption of iron. Will refer to gynecology for evaluation of possible ongoing menorrhagia. Considered IV iron infusion, however this could be prohibitively expensive, and patient prefers to see gynecology first. - referral to gynecology - continue daily oral iron supplementation - repeat CBC at 47-month follow-up

## 2020-04-28 NOTE — Assessment & Plan Note (Signed)
Patient reports since last visit 3 weeks ago, she has had significant decrease in the frequency of her headaches, requiring only occasional naproxen.   Plan: - continue naproxen as needed

## 2020-04-28 NOTE — Assessment & Plan Note (Signed)
BP 142/88 today after repeat (152/87 initially). She reports adherence to amlodipine 10 mg daily. States she has been monitoring her BP at home but did not record the numbers. Reports SBP at home have averaged in the low to mid 130s. States headaches have been less frequent. Denies blurry vision. Is continuing daily celery as recommended by a client.   A/P: Martha Garza's BP is not at goal of <130/80 in the office. Discussed adding an additional medication, and she states she would prefer to work on her lifestyle choices at this time. Additionally, given her iron deficiency anemia, I want to be careful not to increase her lightheadedness.  - continue amlodipine 10 mg daily - reassess need for additional antihypertensive at next visit

## 2020-04-28 NOTE — Assessment & Plan Note (Signed)
Since last [facility] visit on 04/03/20, the patient was seen by dermatology at [facility] on 04/17/20.   Per Care Everywhere: Final Pathologic Diagnosis A. SKIN, RIGHT FOOT, BIOPSY: PSORIASIS. SEE COMMENT B. SKIN, RIGHT LEG, BIOPSY: LICHEN PLANUS  Comment A. There are areas of parakeratosis, subcorneal neutrophilic collections, diminution of the granular layer, psoriasiformepidermal hyperplasia, and dilated blood vessels with thinning of the suprapapillary plates surrounded by a sparseinfiltrate in the papillary dermis. Spongiosis with small vesicles are also noted. The features are most consistent with psoriasis. A PAS was performed and is negative for fungal elements. Clinicopathologic correlation is recommended.  She was prescribed topical steroids, instructed to stop terbinafine, and also prescribe methotrexate 10 mg once weekly for 30 days. She was also started on iron-folic [id number] (Ferrex 150 Forte) 150-1-25 supplementation and for itching prescribed hydroxyzine in addition to the Zyrtec previously prescribed.  She has follow-up with derm on 06/05/20.   Patient reports significant improvement in the rash and itching on her hands since starting therapy. See Media tab or physical exam for picture.  Plan: - continue topical steroids and methotrexate per dermatology - continue iron-folic [id number] supplementation per dermatology - continue daily Zyrtec and nightly hydroxyzine - continue derm follow-up

## 2020-05-02 NOTE — Progress Notes (Signed)
Internal Medicine Clinic Attending  Case discussed with Dr. Watson  At the time of the visit.  We reviewed the resident's history and exam and pertinent patient test results.  I agree with the assessment, diagnosis, and plan of care documented in the resident's note.  

## 2020-05-29 ENCOUNTER — Encounter: Payer: BLUE CROSS/BLUE SHIELD | Admitting: Student

## 2020-06-29 ENCOUNTER — Encounter: Payer: Self-pay | Admitting: *Deleted

## 2020-08-05 ENCOUNTER — Telehealth: Payer: Self-pay | Admitting: Internal Medicine

## 2020-08-05 ENCOUNTER — Ambulatory Visit: Payer: Self-pay | Admitting: Student

## 2020-08-05 ENCOUNTER — Encounter: Payer: Self-pay | Admitting: Student

## 2020-08-05 ENCOUNTER — Other Ambulatory Visit: Payer: Self-pay

## 2020-08-05 VITALS — BP 170/109 | HR 82 | Temp 98.5°F | Ht 65.0 in | Wt 247.9 lb

## 2020-08-05 DIAGNOSIS — R0602 Shortness of breath: Secondary | ICD-10-CM

## 2020-08-05 DIAGNOSIS — R42 Dizziness and giddiness: Secondary | ICD-10-CM

## 2020-08-05 DIAGNOSIS — D500 Iron deficiency anemia secondary to blood loss (chronic): Secondary | ICD-10-CM

## 2020-08-05 DIAGNOSIS — I10 Essential (primary) hypertension: Secondary | ICD-10-CM

## 2020-08-05 LAB — CBC
HCT: 27.5 % — ABNORMAL LOW (ref 36.0–46.0)
Hemoglobin: 8 g/dL — ABNORMAL LOW (ref 12.0–15.0)
MCH: 20.9 pg — ABNORMAL LOW (ref 26.0–34.0)
MCHC: 29.1 g/dL — ABNORMAL LOW (ref 30.0–36.0)
MCV: 72 fL — ABNORMAL LOW (ref 80.0–100.0)
Platelets: 415 10*3/uL — ABNORMAL HIGH (ref 150–400)
RBC: 3.82 MIL/uL — ABNORMAL LOW (ref 3.87–5.11)
RDW: 20.1 % — ABNORMAL HIGH (ref 11.5–15.5)
WBC: 6 10*3/uL (ref 4.0–10.5)
nRBC: 0 % (ref 0.0–0.2)

## 2020-08-05 NOTE — Assessment & Plan Note (Addendum)
Assessment: Patient with episode of lightheadedness, dizziness, shortness of breath with exertion that started yesterday.  Patient states that she was in her usual state of health when she was sitting at her desk and she became to feel lightheaded and dizzy.  She described the dizziness as room spinning.  At this time she started to walk around her house and she noticed that she also became short of breath more easily especially when walking up and down steps.  She attempted to help her son cook dinner and noticed that she felt as though the room was closing in around her.  The symptoms persisted and she called the clinic today to be evaluated.  Patient does have an extensive history of heavy menstrual bleeding and at one point needed blood transfusion due to the severity of her anemia.  Patient is followed closely with OB/GYN and was recently started on progesterone with plan for hysterectomy.  Patient states that this was started 2 weeks ago and that since starting this medication she had 2-week straight of moderate bleeding where she went through 2 boxes of pads as well as multiple tampons and continued to have bleeding through.  Because I was concerned that her symptoms were caused by this a stat CBC was performed.  Patient's hemoglobin was 8 from 8.5.  Orthostatics were performed, patient did have episode of dizziness while going from lying to sitting and sitting to standing, however her blood pressure did not meet the requirements for orthostasis.  Her heart rate responded well.  She was also ambulated around the clinic with a pulse oximeter and her oxygen sats did not drop below 99%.  No history of blood clots, do not suspect her symptoms are secondary to blood clot at this time  Differential for her symptoms includes her anemia and hypertension.  Instructed patient to restart her amlodipine at home as well as to stay as hydrated as possible.  Instructed her to call the clinic in the next 2 days if her  symptoms do not improve.  If patient's symptoms worsen she feels like she is having a medical emergency she was instructed to go to the closest emergency department.  Plan: -Continue amlodipine 10 mg daily -Repeat CBC if symptoms persist -Follow-up as needed if symptoms persist

## 2020-08-05 NOTE — Assessment & Plan Note (Addendum)
Assessment: Blood pressure today 170/109.  Patient denies taking her blood pressure medications for the past 2 or 3 days.  Current regimen of amlodipine 10 mg daily.  Discussed with her that I would restart this medication this evening and that this may be contributing to her symptoms of lightheadedness and dizziness.  Patient is in agreement to restart these medications.  Plan: -Continue amlodipine 10 mg daily -Follow-up blood pressure in 3 months

## 2020-08-05 NOTE — Progress Notes (Signed)
CC: Lightheadedness, dizziness, shortness of breath  HPI:  [name] is a 45 y.o. female with a past medical history stated below and presents today for feeling lightheaded, dizzy, short of breath with ambulation for the past day or so. Please see problem based assessment and plan for additional details.  Past Medical History:  Diagnosis Date   Bronchitis    Hypertension    Pruritus 03/29/2018    Current Outpatient Medications on File Prior to Visit  Medication Sig Dispense Refill   albuterol (PROVENTIL HFA;VENTOLIN HFA) 108 (90 Base) MCG/ACT inhaler Inhale 2-4 puffs into the lungs every 4 (four) hours as needed for wheezing or shortness of breath.      amLODipine (NORVASC) 10 MG tablet Take 1 tablet (10 mg total) by mouth daily. 30 tablet 3   cetirizine (ZYRTEC ALLERGY) 10 MG tablet Take 1 tablet (10 mg total) by mouth daily. 30 tablet 2   clobetasol ointment (TEMOVATE) 0.05 % Apply twice a day on affected areas     ferrous sulfate 325 (65 FE) MG tablet Take 1 tablet (325 mg total) by mouth 3 (three) times daily with meals. 90 tablet 3   gabapentin (NEURONTIN) 100 MG capsule Take 1 capsule (100 mg total) by mouth 3 (three) times daily. 90 capsule 2   hydrOXYzine (ATARAX/VISTARIL) 25 MG tablet Take 1- or 2 tablets by mouth at night time for itching. Do not drive under this medication     iron polysaccharides (NU-IRON) 150 MG capsule Take 1 capsule (150 mg total) by mouth daily. 90 capsule 1   naproxen (NAPROSYN) 500 MG tablet Take 1 tablet (500 mg total) by mouth 2 (two) times daily as needed for moderate pain (menstrual cramps). 20 tablet 0   nicotine polacrilex (NICORETTE) 2 MG gum Take 1 each (2 mg total) by mouth as needed for smoking cessation. 100 each 0   pantoprazole (PROTONIX) 40 MG tablet Take 1 tablet (40 mg total) by mouth daily. 30 tablet 0   Polysacchar Iron-FA-B12 (FERREX 150 FORTE) 150-1-25 MG-MG-MCG CAPS      terbinafine (LAMISIL) 1 % cream Apply 1 application  topically 2 (two) times daily. 30 g 0   triamcinolone (KENALOG) 0.1 % Apply twice a day on affected areas. Do not use on face     No current facility-administered medications on file prior to visit.    Family History  Problem Relation Age of Onset   Cancer Mother    Cancer Father    Cancer Niece     Social History   Socioeconomic History   Marital status: Married    Spouse name: Not on file   Number of children: Not on file   Years of education: Not on file   Highest education level: Not on file  Occupational History   Not on file  Tobacco Use   Smoking status: Every Day    Packs/day: 0.35    Years: 30.00    Pack years: 10.50    Types: Cigarettes   Smokeless tobacco: Never   Tobacco comments:    7cigs  ppd   Vaping Use   Vaping Use: Never used  Substance and Sexual Activity   Alcohol use: Never   Drug use: Never   Sexual activity: Not on file  Other Topics Concern   Not on file  Social History Narrative   Not on file   Social Determinants of Health   Financial Resource Strain: Not on file  Food Insecurity: Not on file  Transportation Needs: Not on file  Physical Activity: Not on file  Stress: Not on file  Social Connections: Not on file  Intimate Partner Violence: Not on file    Review of Systems: ROS negative except for what is noted on the assessment and plan.  Vitals:   08/05/20 1531 08/05/20 1534  BP: (!) 159/79 (!) 170/109  Pulse: 77 82  Temp: 98.5 F (36.9 C)   TempSrc: Oral   SpO2: 98%   Weight: 247 lb 14.4 oz (112.4 kg)   Height: 5\' 5"  (1.651 m)      Physical Exam: Constitutional: Resting in chair comfortably. HENT: normocephalic atraumatic, mucous membranes moist Eyes: conjunctiva non-erythematous Neck: supple Cardiovascular: regular rate and rhythm, 1/6 systolic murmur best heard over second left intercostal space.  Pulses intact, 2+ radial bilateral Pulmonary/Chest: normal work of breathing on room air MSK: normal bulk and tone.   Capillary refill intact. Neurological: alert & oriented x 3 Skin: warm and dry. Psych: Normal mood and thought process   Assessment & Plan:   See Encounters Tab for problem based charting.  Patient discussed with Dr. Newell Coral, D.O. [facility], PGY-2 Pager: [phone], Phone: [phone] Date 08/05/2020 Time 7:19 PM

## 2020-08-05 NOTE — Telephone Encounter (Signed)
RTC, patient states she started feeling dizzy yesterday and is c/o SOB w/ exertion.  She has a hx of menorrhagia and had a CBC on 07/10/20 which showed a HGB of 8.5.   Attending MD notified and approved pt to be added to Dr. Caprice Red schedule today at 1515. SChaplin, RN,BSN

## 2020-08-05 NOTE — Patient Instructions (Signed)
Thank you, Ms.Myrtie Neither for allowing [place] to provide your care today. Today we discussed  Shortness of breath Lightheadedness We checked your hemoglobin levels and they were low at 8.0, but not low enough to the point where I believe you need a blood transfusion.  Please stay hydrated and restart your home blood pressure medications.  Dehydration can cause you feel lightheaded and short of breath as well as having high blood pressure.  If the symptoms persist, please call our clinic if you feel as though you are having a medical emergency please go to the closest emergency department.   I have ordered the following labs for you:   Lab Orders  CBC no Diff     Referrals ordered today:   Referral Orders  No referral(s) requested today     I have ordered the following medication/changed the following medications:   Stop the following medications: There are no discontinued medications.   Start the following medications: No orders of the defined types were placed in this encounter.    Follow up:  1 month     Remember: Please purchase a blood pressure cuff and check your pressures at home.   Should you have any questions or concerns please call the internal medicine clinic at [phone].     Sanjuana Letters, D.O. [place]

## 2020-08-05 NOTE — Telephone Encounter (Signed)
RTC, VM obtained and message left to call nurse triage back. SChaplin, RN,BSN  

## 2020-08-05 NOTE — Telephone Encounter (Signed)
Pt not feeling well, feels very weak, feels faint, feels short of breathe when walking up the steps.  Please call patient back.

## 2020-08-10 ENCOUNTER — Telehealth: Payer: Self-pay | Admitting: Internal Medicine

## 2020-08-10 NOTE — Progress Notes (Signed)
Internal Medicine Clinic Attending  Case discussed with Dr. Johnney Ou  At the time of the visit.  We reviewed the resident's history and exam and pertinent patient test results.  I agree with the assessment, diagnosis, and plan of care documented in the resident's note.  Hg has decreased from 8.5 to 8 since last checked about 3-4 wks prior due to gynecologic blood loss (in Care Everywhere).  She is being followed closely for this problem by specialists.  She is symptomatic though if able to maintain her hydration, she should be able to hold off on a transfusion for now.

## 2020-08-10 NOTE — Telephone Encounter (Signed)
Courtesy call to f/u on Martha Garza's symptoms.  She continues to have positional dizziness, though has new problem of non-traumatic hand swelling, pain, and redness.  She agrees to come for work-in appt tomorrow morning.  We will check Hg again at that time.

## 2020-08-11 ENCOUNTER — Encounter: Payer: Self-pay | Admitting: Student

## 2020-08-11 ENCOUNTER — Ambulatory Visit (INDEPENDENT_AMBULATORY_CARE_PROVIDER_SITE_OTHER): Payer: Self-pay | Admitting: Student

## 2020-08-11 ENCOUNTER — Other Ambulatory Visit: Payer: Self-pay

## 2020-08-11 VITALS — BP 155/85 | HR 81 | Temp 98.7°F | Ht 65.0 in | Wt 245.8 lb

## 2020-08-11 DIAGNOSIS — D509 Iron deficiency anemia, unspecified: Secondary | ICD-10-CM

## 2020-08-11 DIAGNOSIS — L03113 Cellulitis of right upper limb: Secondary | ICD-10-CM | POA: Insufficient documentation

## 2020-08-11 DIAGNOSIS — I10 Essential (primary) hypertension: Secondary | ICD-10-CM

## 2020-08-11 DIAGNOSIS — L03114 Cellulitis of left upper limb: Secondary | ICD-10-CM

## 2020-08-11 DIAGNOSIS — R42 Dizziness and giddiness: Secondary | ICD-10-CM

## 2020-08-11 MED ORDER — CEPHALEXIN 500 MG PO CAPS: 500.0000 mg | ORAL_CAPSULE | Freq: Four times a day (QID) | ORAL | 0 refills | Status: AC

## 2020-08-11 MED ORDER — DICLOFENAC SODIUM 1 % EX GEL: 4.0000 g | Freq: Four times a day (QID) | CUTANEOUS | 0 refills | Status: DC

## 2020-08-11 NOTE — Assessment & Plan Note (Addendum)
Assessment: Hemoglobin of 8 last week.  This is decreased from 8.5 on her prior visit.  She denies any heavy menstrual bleeding since being seen last week.  Plan to repeat hemoglobin levels today.  I do suspect that her anemia is secondary to her menorrhagia.  I suspect she will eventually need a blood transfusion, however we will attempt to have this done outpatient.  Iron studies also ordered.  Plan: -Repeat hemoglobin pending -Iron studies pending

## 2020-08-11 NOTE — Patient Instructions (Signed)
Thank you, Ms.Myrtie Neither for allowing [place] to provide your care today. Today we discussed:  Swelling of the right hand I am so sorry to see that you are having significant pain and swelling of the right hand.  I believe that you have an infection that is causing pain and swelling.  I have written you for an antibiotic, please take this for 10 days.  You will be taking it 4 times a day.  In terms of controlling your plan you can alternate between Tylenol and ibuprofen/naproxen.  For the ibuprofen you can take 400 mg every 4-6 hours as needed. For Tylenol you can take 1000 mg 4 times a day  Please return to the clinic on Friday for recheck.  Dizziness I am glad that your dizziness is improved but because you are still having symptoms I would like to repeat blood work today.  I will follow-up with these results with you when you have your return visit on Friday.  I have ordered the following labs for you:   Lab Orders  CBC no Diff  Ferritin  Iron and IBC ([id number])     Referrals ordered today:   Referral Orders  No referral(s) requested today     I have ordered the following medication/changed the following medications:   Stop the following medications: There are no discontinued medications.   Start the following medications: Meds ordered this encounter  Medications   cephALEXin (KEFLEX) 500 MG capsule    Sig: Take 1 capsule (500 mg total) by mouth 4 (four) times daily for 10 days.    Dispense:  40 capsule    Refill:  0   diclofenac Sodium (VOLTAREN) 1 % GEL    Sig: Apply 4 g topically 4 (four) times daily.    Dispense:  100 g    Refill:  0     Follow up: 08/14/20   Remember: If you the pain does not improve in the next day or so, or worsens, please call our clinic  Should you have any questions or concerns please call the internal medicine clinic at [phone].     Sanjuana Letters, D.O. [place]

## 2020-08-11 NOTE — Assessment & Plan Note (Addendum)
Assessment: Patient presents to the clinic with right hand pain, swelling, erythema.  She states that she noticed some tingling in her right third digit that started 4 days ago.  States that she then developed erythema of her right third MCP, swelling of her right second third and fourth digits, and pain with movement.  She localizes the to the third digit but any flexion or extension of the wrist also elicit this pain.  She denies any trauma to the area.  She denies any bug bites that she knows of.  She denies any drainage from the site itself.  She denies any pain in the elbow joint.  She took naproxen without relief.  On my exam she does have localized tenderness over the right MCP in right third digit.  She has unable to flex her right digits or extend them.  She is also unable to flex or extend the right wrist secondary to pain that she endorses in her third digit and third MCP joint.  The erythema does track proximally to the base of the right wrist.  When her proximal forearm is palpated patient does endorse some pain in the right hand.  I believe that this is secondary to increasing the pressure in that right hand.  Images have been uploaded and can be seen in the chart.  Differential includes cellulitis, psoriatic arthritis.  Low suspicion for septic joint, patient without systemic symptoms of fever or chills. CASPAR criteria (classification criteria for psoriatic arthritis) score of 2, patient's fingers are swollen but do not believe that the swelling is consistent with dactylitis.  There is no evidence of bug bite or puncture site, however with her erythema, edema, significant pain, will give 10-day course of Keflex 500 mg 4 times a day.  If no improvement can consider treatment for psoriatic arthritis.  Patient will return to clinic in 3 days, if no improvement or if pain worsens, we will consult hand specialist and consider imaging.  Patient does work from home and predominantly uses a [employer]  for majority of her work.  I have given her a work note for yesterday and have written her out of work for the next few days until she follows up with [place] in the clinic.  Patient also given strict return precautions, this included worsening swelling or redness, redness tracking up her arm towards her elbow, systemic symptoms such as fever or chills.  Also encourage patient to call clinic if any changes occur.  Plan: -Keflex 500 mg 4 times a day for 10 days end date of 07/22 -Patient with follow-up appointment in 3 days -If no improvement consider psoriatic arthritis treatment, hand specialist referral and imaging

## 2020-08-11 NOTE — Progress Notes (Signed)
CC: Left hand pain, Dizziness  HPI:  [name] is a 45 y.o. female with a past medical history stated below and presents today for left hand pain, swelling, redness as well as continued dizziness. Please see problem based assessment and plan for additional details.  Past Medical History:  Diagnosis Date   Bronchitis    Hypertension    Pruritus 03/29/2018    Current Outpatient Medications on File Prior to Visit  Medication Sig Dispense Refill   albuterol (PROVENTIL HFA;VENTOLIN HFA) 108 (90 Base) MCG/ACT inhaler Inhale 2-4 puffs into the lungs every 4 (four) hours as needed for wheezing or shortness of breath.      amLODipine (NORVASC) 10 MG tablet Take 1 tablet (10 mg total) by mouth daily. 30 tablet 3   cetirizine (ZYRTEC ALLERGY) 10 MG tablet Take 1 tablet (10 mg total) by mouth daily. 30 tablet 2   clobetasol ointment (TEMOVATE) 0.05 % Apply twice a day on affected areas     ferrous sulfate 325 (65 FE) MG tablet Take 1 tablet (325 mg total) by mouth 3 (three) times daily with meals. 90 tablet 3   gabapentin (NEURONTIN) 100 MG capsule Take 1 capsule (100 mg total) by mouth 3 (three) times daily. 90 capsule 2   hydrOXYzine (ATARAX/VISTARIL) 25 MG tablet Take 1- or 2 tablets by mouth at night time for itching. Do not drive under this medication     iron polysaccharides (NU-IRON) 150 MG capsule Take 1 capsule (150 mg total) by mouth daily. 90 capsule 1   naproxen (NAPROSYN) 500 MG tablet Take 1 tablet (500 mg total) by mouth 2 (two) times daily as needed for moderate pain (menstrual cramps). 20 tablet 0   nicotine polacrilex (NICORETTE) 2 MG gum Take 1 each (2 mg total) by mouth as needed for smoking cessation. 100 each 0   pantoprazole (PROTONIX) 40 MG tablet Take 1 tablet (40 mg total) by mouth daily. 30 tablet 0   Polysacchar Iron-FA-B12 (FERREX 150 FORTE) 150-1-25 MG-MG-MCG CAPS      terbinafine (LAMISIL) 1 % cream Apply 1 application topically 2 (two) times daily. 30 g 0    triamcinolone (KENALOG) 0.1 % Apply twice a day on affected areas. Do not use on face     No current facility-administered medications on file prior to visit.    Family History  Problem Relation Age of Onset   Cancer Mother    Cancer Father    Cancer Niece     Social History   Socioeconomic History   Marital status: Married    Spouse name: Not on file   Number of children: Not on file   Years of education: Not on file   Highest education level: Not on file  Occupational History   Not on file  Tobacco Use   Smoking status: Every Day    Packs/day: 0.35    Years: 30.00    Pack years: 10.50    Types: Cigarettes   Smokeless tobacco: Never   Tobacco comments:    7cigs  ppd   Vaping Use   Vaping Use: Never used  Substance and Sexual Activity   Alcohol use: Never   Drug use: Never   Sexual activity: Not on file  Other Topics Concern   Not on file  Social History Narrative   Not on file   Social Determinants of Health   Financial Resource Strain: Not on file  Food Insecurity: Not on file  Transportation Needs: Not on file  Physical Activity: Not on file  Stress: Not on file  Social Connections: Not on file  Intimate Partner Violence: Not on file    Review of Systems: ROS negative except for what is noted on the assessment and plan.  Vitals:   08/11/20 0907 08/11/20 0908  BP:  (!) 155/85  Pulse:  81  Temp:  98.7 F (37.1 C)  TempSrc:  Oral  SpO2:  100%  Weight: 245 lb 12.8 oz (111.5 kg)   Height: 5\' 5"  (1.651 m)    Physical Exam: Constitutional: uncomfortable appearing HENT: normocephalic atraumatic Eyes: conjunctiva non-erythematous Neck: supple Cardiovascular: regular rate and rhythm, 2/6 systolic murmur.  Right & left radial pulses intact, 2+ bilaterally Pulmonary/Chest: normal work of breathing on room air Abdominal: soft, non-tender, non-distended MSK: normal bulk and tone.  Patient with tenderness to palpation of the third right digit and dorsal  aspect of the right hand. Erythema of right third MCP joint that extends proximally to base of patient's wrist.  Edema of the second, third, and fourth digits that extends proximally to her elbow.  Wrist motion limited , patient unable to flex, extend wrist. Able to internally and externally rotate r. Wrist. Capillary refill under 2 seconds.  No nail lesions appreciated. Neurological: alert & oriented x 3, normal sensation right hand. Skin: warm and dry. No puncture wounds/bite marks of right hand.  Psych: normal mood and thought process        Assessment & Plan:   See Encounters Tab for problem based charting.  Patient seen with Dr. Newell Coral, D.O. [facility], PGY-2 Pager: [phone], Phone: [phone] Date 08/11/2020 Time 11:37 AM

## 2020-08-11 NOTE — Assessment & Plan Note (Signed)
Assessment: Patient states since her visit last week that her dizziness lightheadedness has significantly improved however she did endorse an episode yesterday.  Suspect that her improved blood pressure helped with lightheaded and dizziness, component of anemia may still be present.  We will continue with her anemia work-up and transfuse if need be.  Plan: -Continue anemia work-up

## 2020-08-11 NOTE — Assessment & Plan Note (Addendum)
Assessment: BP today's visit improved 155/85.  Patient states that since being seen last week she has restarted her blood pressure medication of amlodipine 10 mg daily.  I suspect the elevation today is secondary to patients right hand pain.  We will repeat at follow-up visit on Friday.  Also encourage patient to check blood pressures regularly at home, 1-2 times a week when relaxed.  Plan: -Continue amlodipine 10 mg daily -Repeat at next visit and encouraged to continue taking home blood pressure readings, consider addition of  ARB if no improvement and home readings with persistently the elevated pressures.

## 2020-08-12 ENCOUNTER — Other Ambulatory Visit: Payer: Self-pay | Admitting: Student

## 2020-08-12 ENCOUNTER — Telehealth (INDEPENDENT_AMBULATORY_CARE_PROVIDER_SITE_OTHER): Payer: Self-pay | Admitting: Student

## 2020-08-12 DIAGNOSIS — D509 Iron deficiency anemia, unspecified: Secondary | ICD-10-CM

## 2020-08-12 LAB — CBC
Hematocrit: 30.6 % — ABNORMAL LOW (ref 34.0–46.6)
Hemoglobin: 8.8 g/dL — ABNORMAL LOW (ref 11.1–15.9)
MCH: 20.6 pg — ABNORMAL LOW (ref 26.6–33.0)
MCHC: 28.8 g/dL — ABNORMAL LOW (ref 31.5–35.7)
MCV: 72 fL — ABNORMAL LOW (ref 79–97)
Platelets: 415 10*3/uL (ref 150–450)
RBC: 4.28 x10E6/uL (ref 3.77–5.28)
RDW: 18.9 % — ABNORMAL HIGH (ref 11.7–15.4)
WBC: 7.6 10*3/uL (ref 3.4–10.8)

## 2020-08-12 LAB — IRON AND TIBC
Iron Saturation: 3 % — CL (ref 15–55)
Iron: 12 ug/dL — ABNORMAL LOW (ref 27–159)
Total Iron Binding Capacity: 447 ug/dL (ref 250–450)
UIBC: 435 ug/dL — ABNORMAL HIGH (ref 131–425)

## 2020-08-12 LAB — FERRITIN: Ferritin: 6 ng/mL — ABNORMAL LOW (ref 15–150)

## 2020-08-12 MED ORDER — SODIUM CHLORIDE 0.9 % IV SOLN: 510.0000 mg | INTRAVENOUS | Status: DC

## 2020-08-12 NOTE — Telephone Encounter (Addendum)
Ms. Limes was called and updated on her iron study results.  Discussed with her that I would like her to receive 2 iron transfusions, a week apart.  She is in agreement with this.  We will place the order and have the infusion center follow up with her.   In terms of her hand swelling, patient states that the swelling has improved as well as the pain.  Discussed that we will continue our follow-up appointment on Friday to confirm that it continues to improve.  She also wanted to inform me that she has started having light vaginal bleeding again.  She notes that the OB/GYN had said that when she started this medication she did have no more bleeding.  I encouraged her to call their office and schedule an appointment to see them soon, but that we would also address this at her follow-up appointment in 2 days.  If any other changes occurred encourage patient to call the clinic back.  Order placed for IV Feraheme.

## 2020-08-14 ENCOUNTER — Other Ambulatory Visit: Payer: Self-pay | Admitting: Student

## 2020-08-14 ENCOUNTER — Telehealth: Payer: Self-pay | Admitting: Student

## 2020-08-14 ENCOUNTER — Other Ambulatory Visit: Payer: Self-pay

## 2020-08-14 ENCOUNTER — Encounter: Payer: Self-pay | Admitting: Internal Medicine

## 2020-08-14 ENCOUNTER — Encounter: Payer: Medicaid Other | Admitting: Student

## 2020-08-14 ENCOUNTER — Ambulatory Visit (HOSPITAL_COMMUNITY)
Admission: RE | Admit: 2020-08-14 | Discharge: 2020-08-14 | Disposition: A | Discharge status: Home / Self Care | Payer: Medicaid Other | Source: Ambulatory Visit | Attending: Internal Medicine | Admitting: Internal Medicine

## 2020-08-14 ENCOUNTER — Ambulatory Visit (INDEPENDENT_AMBULATORY_CARE_PROVIDER_SITE_OTHER): Payer: Self-pay | Admitting: Internal Medicine

## 2020-08-14 VITALS — BP 142/82 | HR 60 | Temp 98.5°F | Ht 65.0 in | Wt 248.4 lb

## 2020-08-14 DIAGNOSIS — D509 Iron deficiency anemia, unspecified: Secondary | ICD-10-CM

## 2020-08-14 DIAGNOSIS — I10 Essential (primary) hypertension: Secondary | ICD-10-CM

## 2020-08-14 DIAGNOSIS — L03113 Cellulitis of right upper limb: Secondary | ICD-10-CM

## 2020-08-14 NOTE — Progress Notes (Signed)
Patient ID: Martha Garza, female   DOB: Jun 08, 1975, 45 y.o.   MRN: [id number]   CC: R hand cellulitis follow-up  HPI:  [name] is a 45 y.o. female with a past medical history as listed below who presents today for follow-up for cellulitis of her R hand primarily located over the dorsal aspect of her third MCP with proximal extension to the base of the wrist.   She was first seen on Tuesday 08/12/2020 at which time she was started on cephalexin 500 mg, 4 times per day and notes that she did gradually start to feel better with decreased pain, redness and swelling. She admits to doing housework and other chores yesterday and this has caused the redness, swelling, and pain to return. She denies any symptoms of fever or chills since beginning antibiotics and is tolerating them well at this time.  Past Medical History:  Diagnosis Date   Bronchitis    Hypertension    Pruritus 03/29/2018   Review of Systems:  Review of Systems  Constitutional:  Negative for chills and fever.  Respiratory:  Negative for shortness of breath.   Cardiovascular:  Negative for chest pain.  Gastrointestinal:  Negative for abdominal pain, constipation, diarrhea, nausea and vomiting.  Musculoskeletal:  Positive for joint pain (worse over third MCP with new tenderness in second and fourth MCPs, with pain over dorsal aspect of hand as well). Negative for myalgias.  Neurological:  Positive for tingling (in R third finger). Negative for dizziness and headaches.    Physical Exam:  Vitals:   08/14/20 0943 08/14/20 1050  BP: (!) 171/100 (!) 142/82  Pulse: 65 60  Temp: 98.5 F (36.9 C)   TempSrc: Oral   SpO2: 100%   Weight: 248 lb 6.4 oz (112.7 kg)   Height: 5\' 5"  (1.651 m)    Physical Exam Constitutional:      Appearance: Normal appearance.  Cardiovascular:     Rate and Rhythm: Normal rate and regular rhythm.     Heart sounds: Normal heart sounds.  Pulmonary:     Effort: Pulmonary effort is normal.      Breath sounds: Normal breath sounds and air entry.  Musculoskeletal:     Right forearm: Tenderness present. No swelling or edema.     Right wrist: Tenderness present. No swelling or effusion. Normal range of motion.     Right hand: Swelling (all digits are swollen. increased over dorsal aspect of third MCP extending 4cm proximally with redness over swelling. This area is also warm to touch when compared to left hand.) and tenderness present. Decreased range of motion (limited by pain).     Right lower leg: No edema.     Left lower leg: No edema.  Skin:    General: Skin is warm and dry.     Findings: Erythema (localized to swelling of dorsal aspect of hand from third MCP extending proximally) present.       Psychiatric:        Behavior: Behavior normal. Behavior is cooperative.        Assessment & Plan:   See Encounters Tab for problem based charting.  Patient seen with Dr. Jimmye Norman

## 2020-08-14 NOTE — Telephone Encounter (Signed)
Feraheme reordered

## 2020-08-14 NOTE — Assessment & Plan Note (Signed)
Patient has not yet received Ferriheme infusion but denies symptoms of dizziness, fatigue, shortness of breath today. Hgb 08/12/2020 increased to 8.8 from 8.0 on 08/05/2020).

## 2020-08-14 NOTE — Assessment & Plan Note (Addendum)
Patient reports that there had been improvement in redness, swelling, and pain over the affected area since beginning antibiotics but she did some housework and had a return of these symptoms. She was experiencing no pain but now rates it a 6/10 and has swelling of all digits of the R hand. She also complains of new tenderness of MCP joints of second and fourth fingers. - X-ray of R hand ordered - Urgent referral to hand surgeon for evaluation - Patient to return to clinic on Monday 08/17/2020 unless she is able to be seen by the hand surgeon on Monday. - Continue cephalexin 500 mg four times per day. - Patient instructed to call clinic or after hours number if she develops worsening pain, swelling, or develops a fever.

## 2020-08-14 NOTE — Patient Instructions (Signed)
Thank you for visiting the Internal Medicine Clinic today. It was a pleasure to meet you!  Today we discussed the infection on your right hand. The redness, pain to your wrist and middle finger, and swelling do seem to have improved since starting antibiotics however we are concerned as there is continued tenderness with new tenderness at other joints beside the middle finger.   I have ordered a hand x-ray that can be done at this time. This will help [place] see if there is anything in the hand or finger joint that could be causing this. I have also sent an urgent referral to our hand specialists to evaluate you as well. I have asked that, if possible, they see you on Monday.   I would also like you to schedule a follow-up appointment with our clinic for Monday. IF  the hand specialists are unable to see you on Monday, please keep your follow-up appointment with [place] so that we can make sure you continue to improve and that there has not been worsening.  In the meantime, continue taking cephalexin 500 mg four times per day.  If you develop increased pain, swelling, or redness, or develop a fever, please reach out to our clinic at [phone] between 9am-5pm. If it is after hours, please call [phone] and ask for the internal medicine resident on call. If you feel you are having a medical emergency please call 911.  Dr. Marlou Sa

## 2020-08-14 NOTE — Assessment & Plan Note (Signed)
Initial BP reading today was 171/100 but decreased to 142/82 with recheck. Patient will keep a log of home BP readings a few times per week so that any appropriate management decisions can be made.

## 2020-08-17 ENCOUNTER — Ambulatory Visit (INDEPENDENT_AMBULATORY_CARE_PROVIDER_SITE_OTHER): Payer: Self-pay | Admitting: Student

## 2020-08-17 ENCOUNTER — Other Ambulatory Visit: Payer: Self-pay

## 2020-08-17 DIAGNOSIS — L03113 Cellulitis of right upper limb: Secondary | ICD-10-CM

## 2020-08-17 NOTE — Progress Notes (Signed)
CC: Right hand pain  HPI:  [name] is a 45 y.o. female with a past medical history stated below and presents today for follow-up of right hand pain. Please see problem based assessment and plan for additional details.  Past Medical History:  Diagnosis Date   Bronchitis    Hypertension    Pruritus 03/29/2018    Current Outpatient Medications on File Prior to Visit  Medication Sig Dispense Refill   albuterol (PROVENTIL HFA;VENTOLIN HFA) 108 (90 Base) MCG/ACT inhaler Inhale 2-4 puffs into the lungs every 4 (four) hours as needed for wheezing or shortness of breath.      amLODipine (NORVASC) 10 MG tablet Take 1 tablet (10 mg total) by mouth daily. 30 tablet 3   cephALEXin (KEFLEX) 500 MG capsule Take 1 capsule (500 mg total) by mouth 4 (four) times daily for 10 days. 40 capsule 0   cetirizine (ZYRTEC ALLERGY) 10 MG tablet Take 1 tablet (10 mg total) by mouth daily. 30 tablet 2   clobetasol ointment (TEMOVATE) 0.05 % Apply twice a day on affected areas     diclofenac Sodium (VOLTAREN) 1 % GEL Apply 4 g topically 4 (four) times daily. 100 g 0   ferrous sulfate 325 (65 FE) MG tablet Take 1 tablet (325 mg total) by mouth 3 (three) times daily with meals. 90 tablet 3   gabapentin (NEURONTIN) 100 MG capsule Take 1 capsule (100 mg total) by mouth 3 (three) times daily. 90 capsule 2   hydrOXYzine (ATARAX/VISTARIL) 25 MG tablet Take 1- or 2 tablets by mouth at night time for itching. Do not drive under this medication     iron polysaccharides (NU-IRON) 150 MG capsule Take 1 capsule (150 mg total) by mouth daily. 90 capsule 1   naproxen (NAPROSYN) 500 MG tablet Take 1 tablet (500 mg total) by mouth 2 (two) times daily as needed for moderate pain (menstrual cramps). 20 tablet 0   nicotine polacrilex (NICORETTE) 2 MG gum Take 1 each (2 mg total) by mouth as needed for smoking cessation. 100 each 0   pantoprazole (PROTONIX) 40 MG tablet Take 1 tablet (40 mg total) by mouth daily. 30 tablet 0    Polysacchar Iron-FA-B12 (FERREX 150 FORTE) 150-1-25 MG-MG-MCG CAPS      terbinafine (LAMISIL) 1 % cream Apply 1 application topically 2 (two) times daily. 30 g 0   triamcinolone (KENALOG) 0.1 % Apply twice a day on affected areas. Do not use on face     No current facility-administered medications on file prior to visit.    Family History  Problem Relation Age of Onset   Cancer Mother    Cancer Father    Cancer Niece     Social History   Socioeconomic History   Marital status: Married    Spouse name: Not on file   Number of children: Not on file   Years of education: Not on file   Highest education level: Not on file  Occupational History   Not on file  Tobacco Use   Smoking status: Every Day    Packs/day: 0.35    Years: 30.00    Pack years: 10.50    Types: Cigarettes   Smokeless tobacco: Never   Tobacco comments:    7cigs  ppd   Vaping Use   Vaping Use: Never used  Substance and Sexual Activity   Alcohol use: Never   Drug use: Never   Sexual activity: Not on file  Other Topics Concern  Not on file  Social History Narrative   Not on file   Social Determinants of Health   Financial Resource Strain: Not on file  Food Insecurity: Not on file  Transportation Needs: Not on file  Physical Activity: Not on file  Stress: Not on file  Social Connections: Not on file  Intimate Partner Violence: Not on file    Review of Systems: ROS negative except for what is noted on the assessment and plan.  Vitals:   08/17/20 1416  BP: 136/66  Pulse: 89  Temp: 98.1 F (36.7 C)  TempSrc: Oral  SpO2: 100%  Weight: 250 lb (113.4 kg)  Height: 5\' 5"  (1.651 m)    Physical Exam: Constitutional: Well-appearing in no acute distress HENT: normocephalic atraumatic Eyes: conjunctiva non-erythematous Neck: supple Pulmonary/Chest: normal work of breathing on room air MSK: normal bulk and tone.  Right hand with full active and passive range of motion of the wrist and digits  without tenderness.  Erythema over the third MCP joint as well as second MCP joint.  Right hand is warm to touch compared to left. Neurological: alert & oriented x 3 Skin: warm and dry Psych: Normal mood and thought process   Assessment & Plan:   See Encounters Tab for problem based charting.  Patient seen with Dr. Newell Coral, D.O. [facility], PGY-2 Pager: [phone], Phone: [phone] Date 08/17/2020 Time 6:28 PM

## 2020-08-17 NOTE — Assessment & Plan Note (Addendum)
Assessment: Patient with significant improvement of mobility of right hand.  Patient without tenderness to deep palpation of the right hand, specifically the third MCP joint.  X-ray imaging of the right hand did not show any joint abnormalities or diffuse swelling.  No bony fractures noted.  Unremarkable right hand x-ray reviewed by myself and Dr. Jimmye Norman.  She continues to have some erythema over the second and third MCP joints.  Overall pleased to see the improvement.  Discussed with patient and attending Dr. Jimmye Norman risks and benefits of patient following with hand specialist.  Still uncertain as to the underlying etiology of the symptoms she has been having of her right hand.  We will continue to monitor and patient given strict return precautions if worsening occurs  She will complete 10-day course of Keflex on 7/22.  Plan: -Complete Keflex on 7/22 -Follow-up in our clinic or call if no improvement or worsening

## 2020-08-17 NOTE — Patient Instructions (Signed)
Thank you, [name] Neither for allowing [place] to provide your care today. Today we discussed   Right hand swelling Your right hand swelling has improved significantly glad to see this.  Please continue to take your antibiotics.  I will call you Friday to check in and see how you are doing.  If your hand gets worse please call our clinic.  If it gets worse quickly please go to the closest emergency department.  I have ordered the following labs for you:  Lab Orders  No laboratory test(s) ordered today    Referrals ordered today:   Referral Orders  No referral(s) requested today     I have ordered the following medication/changed the following medications:   Stop the following medications: There are no discontinued medications.   Start the following medications: No orders of the defined types were placed in this encounter.      Should you have any questions or concerns please call the internal medicine clinic at [phone].     Sanjuana Letters, D.O. [place]

## 2020-08-18 NOTE — Progress Notes (Signed)
Internal Medicine Clinic Attending  I saw and evaluated the patient.  I personally confirmed the key portions of the history and exam documented by Dr. Katsadouros and I reviewed pertinent patient test results.  The assessment, diagnosis, and plan were formulated together and I agree with the documentation in the resident's note.  

## 2020-08-22 NOTE — Progress Notes (Signed)
Internal Medicine Clinic Attending  I saw and evaluated the patient.  I personally confirmed the key portions of the history and exam documented by Dr. Katsadouros and I reviewed pertinent patient test results.  The assessment, diagnosis, and plan were formulated together and I agree with the documentation in the resident's note.  

## 2020-08-24 ENCOUNTER — Encounter (HOSPITAL_COMMUNITY)
Admission: RE | Admit: 2020-08-24 | Discharge: 2020-08-24 | Disposition: A | Discharge status: Home / Self Care | Payer: Medicaid Other | Source: Ambulatory Visit | Attending: Internal Medicine | Admitting: Internal Medicine

## 2020-08-24 DIAGNOSIS — D509 Iron deficiency anemia, unspecified: Secondary | ICD-10-CM | POA: Insufficient documentation

## 2020-08-24 MED ORDER — SODIUM CHLORIDE 0.9 % IV SOLN
510.0000 mg | INTRAVENOUS | Status: DC
  Administered 2020-08-24: 510 mg via INTRAVENOUS
  Filled 2020-08-24: qty 510

## 2020-08-31 ENCOUNTER — Other Ambulatory Visit: Payer: Self-pay

## 2020-08-31 ENCOUNTER — Encounter (HOSPITAL_COMMUNITY)
Admission: RE | Admit: 2020-08-31 | Discharge: 2020-08-31 | Disposition: A | Discharge status: Home / Self Care | Payer: Self-pay | Source: Ambulatory Visit | Attending: Otolaryngology | Admitting: Otolaryngology

## 2020-08-31 DIAGNOSIS — D509 Iron deficiency anemia, unspecified: Secondary | ICD-10-CM | POA: Insufficient documentation

## 2020-08-31 MED ORDER — SODIUM CHLORIDE 0.9 % IV SOLN
510.0000 mg | INTRAVENOUS | Status: DC
  Administered 2020-08-31: 510 mg via INTRAVENOUS
  Filled 2020-08-31: qty 17

## 2020-09-10 NOTE — Progress Notes (Signed)
Internal Medicine Clinic Attending  I saw and evaluated the patient.  I personally confirmed the key portions of the history and exam documented by Dr.  Dean  and I reviewed pertinent patient test results.  The assessment, diagnosis, and plan were formulated together and I agree with the documentation in the resident's note.  

## 2020-09-21 ENCOUNTER — Telehealth: Payer: Self-pay | Admitting: Student

## 2020-09-21 ENCOUNTER — Other Ambulatory Visit: Payer: Self-pay | Admitting: Student

## 2020-09-21 MED ORDER — NAPROXEN 500 MG PO TABS: 500.0000 mg | ORAL_TABLET | Freq: Two times a day (BID) | ORAL | 0 refills | Status: DC | PRN

## 2020-09-21 NOTE — Telephone Encounter (Signed)
Refill Request  naproxen (NAPROSYN) 500 MG tablet     [place], [place] (Ph: [phone])  Order Details

## 2020-09-21 NOTE — Telephone Encounter (Signed)
Duplicate request Encounter closed .Despina Hidden Cassady8/22/20221:06 PM

## 2020-09-21 NOTE — Telephone Encounter (Signed)
Refill Request   naproxen (NAPROSYN) 500 MG tablet   [place], [place] (Ph: [phone])  Order Details

## 2020-09-24 ENCOUNTER — Encounter: Payer: Self-pay | Admitting: Internal Medicine

## 2020-09-24 ENCOUNTER — Ambulatory Visit: Payer: Self-pay | Admitting: Internal Medicine

## 2020-09-24 ENCOUNTER — Other Ambulatory Visit: Payer: Self-pay

## 2020-09-24 VITALS — BP 138/80 | HR 69 | Temp 98.8°F | Ht 66.0 in | Wt 251.7 lb

## 2020-09-24 DIAGNOSIS — D509 Iron deficiency anemia, unspecified: Secondary | ICD-10-CM

## 2020-09-24 NOTE — Assessment & Plan Note (Addendum)
Patient presents for follow-up regarding her anemia.  States that she has received 2 iron infusions.  Initially she was feeling really well after these infusions for about a week.  Then she noted that she started having shortness of breath with exertion again.  She was taking oral contraceptive which was started about 2 months ago however noticed that her menstruation was much heavier and she continued bleeding for an entire month so she stopped taking this medication.  She messaged her OB/GYN about alternative options and has a follow-up scheduled for September.  She has not been taking oral iron supplements since her infusion.  Recommended she continue her oral iron supplements in the meantime.  She is not sure of which when she was most recently taking, both Ferrex and and Nu-iron listed on her medication list.  Patient will check and confirm.  Plan: Patient presents with iron deficiency anemia secondary to menorrhagia.  She has received 2 Feraheme infusions.  Unfortunately she did not tolerate the initial oral contraceptive prescribed and is not currently on any treatment for her menorrhagia.  She is to follow-up with her OB/GYN next month.  In the meantime recommended she continue oral iron supplements and we will recheck some blood work today.  -Follow-up CBC and iron panel studies -Continue oral iron supplements -Follow-up with OB/GYN for alternative OCPs   ADDENDUM: Iron studies showed an iron of 20, saturation of 6.  Recommended another iron infusion.  Patient agrees with this plan.

## 2020-09-24 NOTE — Patient Instructions (Signed)
I will call you with the results of your lab work.  In the meantime continue taking your iron every other day.

## 2020-09-24 NOTE — Progress Notes (Signed)
   CC: Iron deficiency anemia  HPI:  [name] is a 45 y.o. with a past medical history listed below presenting for follow-up of her iron deficiency anemia. For details of today's visit and the status of his chronic medical issues please refer to the assessment and plan.   Past Medical History:  Diagnosis Date   Bronchitis    Hypertension    Pruritus 03/29/2018   Review of Systems: Negative except as per assessment and plan  Physical Exam:  Vitals:   09/24/20 1525 09/24/20 1533  BP: (!) 155/87 138/80  Pulse: 72 69  Temp: 98.8 F (37.1 C)   TempSrc: Oral   SpO2: 99%   Weight: 251 lb 11.2 oz (114.2 kg)   Height: '5\' 6"'$  (1.676 m)    Physical Exam General: alert, appears stated age, in no acute distress HEENT: Normocephalic, atraumatic, EOM intact, conjunctiva normal CV: Regular rate and rhythm, no murmurs rubs or gallops Pulm: Clear to auscultation bilaterally, normal work of breathing Abdomen: Soft, nondistended, bowel sounds present, no tenderness to palpation MSK: No lower extremity edema Skin: Warm and dry Neuro: Alert and oriented x3   Assessment & Plan:   See Encounters Tab for problem based charting.  Patient discussed with Dr. Evette Doffing

## 2020-09-25 LAB — CBC WITH DIFFERENTIAL/PLATELET
Basophils Absolute: 0.1 10*3/uL (ref 0.0–0.2)
Basos: 1 %
EOS (ABSOLUTE): 0.2 10*3/uL (ref 0.0–0.4)
Eos: 4 %
Hematocrit: 30.8 % — ABNORMAL LOW (ref 34.0–46.6)
Hemoglobin: 9.4 g/dL — ABNORMAL LOW (ref 11.1–15.9)
Immature Grans (Abs): 0 10*3/uL (ref 0.0–0.1)
Immature Granulocytes: 1 %
Lymphocytes Absolute: 2.1 10*3/uL (ref 0.7–3.1)
Lymphs: 34 %
MCH: 24.4 pg — ABNORMAL LOW (ref 26.6–33.0)
MCHC: 30.5 g/dL — ABNORMAL LOW (ref 31.5–35.7)
MCV: 80 fL (ref 79–97)
Monocytes Absolute: 0.3 10*3/uL (ref 0.1–0.9)
Monocytes: 4 %
Neutrophils Absolute: 3.6 10*3/uL (ref 1.4–7.0)
Neutrophils: 56 %
Platelets: 226 10*3/uL (ref 150–450)
RBC: 3.86 x10E6/uL (ref 3.77–5.28)
RDW: 22.3 % — ABNORMAL HIGH (ref 11.7–15.4)
WBC: 6.3 10*3/uL (ref 3.4–10.8)

## 2020-09-25 LAB — IRON,TIBC AND FERRITIN PANEL
Ferritin: 28 ng/mL (ref 15–150)
Iron Saturation: 6 % — CL (ref 15–55)
Iron: 20 ug/dL — ABNORMAL LOW (ref 27–159)
Total Iron Binding Capacity: 325 ug/dL (ref 250–450)
UIBC: 305 ug/dL (ref 131–425)

## 2020-09-28 MED ORDER — FERUMOXYTOL INJECTION 510 MG/17 ML: 510.0000 mg | Freq: Once | INTRAVENOUS | 0 refills | Status: DC

## 2020-09-28 NOTE — Addendum Note (Signed)
Addended by: Mike Craze on: 09/28/2020 11:10 AM   Modules accepted: Orders

## 2020-09-29 ENCOUNTER — Other Ambulatory Visit (HOSPITAL_COMMUNITY): Payer: Self-pay | Admitting: *Deleted

## 2020-09-29 ENCOUNTER — Telehealth: Payer: Self-pay | Admitting: *Deleted

## 2020-09-29 MED ORDER — SODIUM CHLORIDE 0.9 % IV SOLN: 500.0000 mg | Freq: Every day | INTRAVENOUS | Status: AC

## 2020-09-29 MED ORDER — SODIUM CHLORIDE 0.9 % IV SOLN: 25.0000 mg | Freq: Once | INTRAVENOUS | Status: DC

## 2020-09-29 NOTE — Progress Notes (Signed)
Internal Medicine Clinic Attending  Case discussed with Dr. Laural Golden  At the time of the visit.  We reviewed the resident's history and exam and pertinent patient test results.  I agree with the assessment, diagnosis, and plan of care documented in the resident's note.   Orders placed for outpatient infusion of iron dextran with test dose, as there is a shortage of feraheme right now.

## 2020-09-29 NOTE — Addendum Note (Signed)
Addended by: Mike Craze on: 09/29/2020 10:54 AM   Modules accepted: Orders

## 2020-09-29 NOTE — Addendum Note (Signed)
Addended by: Lalla Brothers T on: 09/29/2020 09:10 AM   Modules accepted: Orders, SmartSet

## 2020-09-29 NOTE — Telephone Encounter (Signed)
Called / talked to Bridgett, infusion center here at [facility] Appt has been schedule for iron infusion, their first available, Friday 10/09/20 @ 0900 AM.  Hulen Skains pt - informed of above appt date and time.

## 2020-09-29 NOTE — Addendum Note (Signed)
Addended byGaylan Gerold on: 09/29/2020 01:39 PM   Modules accepted: Orders

## 2020-10-09 ENCOUNTER — Telehealth: Payer: Self-pay

## 2020-10-09 ENCOUNTER — Other Ambulatory Visit: Payer: Self-pay

## 2020-10-09 ENCOUNTER — Encounter (HOSPITAL_COMMUNITY)
Admission: RE | Admit: 2020-10-09 | Discharge: 2020-10-09 | Disposition: A | Discharge status: Home / Self Care | Payer: Self-pay | Source: Ambulatory Visit | Attending: Otolaryngology | Admitting: Otolaryngology

## 2020-10-09 DIAGNOSIS — D509 Iron deficiency anemia, unspecified: Secondary | ICD-10-CM | POA: Insufficient documentation

## 2020-10-09 MED ORDER — SODIUM CHLORIDE 0.9 % IV SOLN
500.0000 mg | Freq: Once | INTRAVENOUS | Status: AC
  Administered 2020-10-09: 500 mg via INTRAVENOUS
  Filled 2020-10-09: qty 10

## 2020-10-09 MED ORDER — SODIUM CHLORIDE 0.9 % IV SOLN
25.0000 mg | Freq: Once | INTRAVENOUS | Status: AC
  Administered 2020-10-09: 25 mg via INTRAVENOUS
  Filled 2020-10-09: qty 0.5

## 2020-10-09 NOTE — Telephone Encounter (Signed)
Pt states her legs are swollen, requesting to speak with a nurse. Please call back.

## 2020-10-09 NOTE — Telephone Encounter (Signed)
Return pt's call - stated her left leg swells during the day. But when she elevates it at night, the swelling is down in the am. It started on Monday; stated she went dancing over the w/e holiday and thinks she over did it. No other c/o's. Unable to come in to be evaluated until Tuesday of next week. Instructed pt to go to UC/ED if develop pain,redness and/or increased swelling. Voiced understanding. Appt schedule 0845 Am on 9/13 w/Dr Gilford Rile.

## 2020-10-13 ENCOUNTER — Encounter: Payer: Medicaid Other | Admitting: Internal Medicine

## 2020-12-10 ENCOUNTER — Telehealth: Payer: Self-pay | Admitting: *Deleted

## 2020-12-10 NOTE — Telephone Encounter (Signed)
Called pt per Dr Johnney Ou - stated she will be ok until her appt on Monday  and stated she knows to come in or go to the ED if her condition becomes worse.

## 2020-12-10 NOTE — Telephone Encounter (Signed)
Call from pt stating she has been sob x 2 days and she thinks her iron level is low. Last infusion was 10/09/20. Offered pt an appt for tomorrow am; states she can only come in the afternoons after 1530 PM b/c of her job. First available will be Monday - states she has her inhaler so she will be ok until then when I asked her d/t sob. Appt schedule with Dr Johnney Ou on Monday 11/14 @ 1545PM.

## 2020-12-14 ENCOUNTER — Ambulatory Visit: Payer: Self-pay | Admitting: Student

## 2020-12-14 VITALS — BP 134/80 | HR 74 | Temp 98.2°F | Ht 66.0 in | Wt 246.8 lb

## 2020-12-14 DIAGNOSIS — I10 Essential (primary) hypertension: Secondary | ICD-10-CM

## 2020-12-14 DIAGNOSIS — Z720 Tobacco use: Secondary | ICD-10-CM

## 2020-12-14 DIAGNOSIS — R0602 Shortness of breath: Secondary | ICD-10-CM | POA: Insufficient documentation

## 2020-12-14 DIAGNOSIS — D509 Iron deficiency anemia, unspecified: Secondary | ICD-10-CM

## 2020-12-14 MED ORDER — NAPROXEN 500 MG PO TABS: 500.0000 mg | ORAL_TABLET | Freq: Two times a day (BID) | ORAL | 0 refills | Status: DC | PRN

## 2020-12-14 NOTE — Assessment & Plan Note (Signed)
Discussed with patient that with her chronic lung disease, her smoking will cause progression. She acknowledged understanding and that she would not like assistance with quitting at this time. She has set a date to stop smoking when she moves to her new apartment next month.

## 2020-12-14 NOTE — Assessment & Plan Note (Addendum)
Assessment: Patient states she has felt short of breath for the past few weeks.  She had a cold 1 month ago with symptoms of chills, fever, body aches and a productive cough.  She tested negative for COVID 2 times.  Denies being tested for flu.  She states that most of her symptoms have resolved, she continues to have a chronic cough as well as started having dyspnea with exertion.  This was not present prior to this cold.  She denies any episodes of chest pain with this however she did note some left-sided upper chest pain overnight that has resolved this morning.  Suspect that this is costochondritis or MSK in nature.  She is euvolemic on exam and without tachycardia.  Do not suspect that this is cardiac in nature, do not suspect a pulmonary emboli.  She has continued to smoke cigarettes and along with the recent URI and her history of anemia, suspect that all of these are playing a role.  Do not suspect pneumonia or acute bronchitis.  We will continue to monitor for improvement, I do believe that she will need repeat pulmonary function test once her insurance is approved.  She uses an albuterol inhaler provided by friend, she states that this helps when she is walking up and down stairs.  Will also order CBC iron panel and ferritin to check her iron levels, with ongoing heavy vaginal bleeding likely playing a role.  Last iron infusion was late August/early September 2022. She has ongoing anemia secondary to heavy vaginal bleeding.  She is following up with OB/GYN and is planned to have surgery next month.  Plan: -Continue to follow with patient, follow-up iron studies and ferritin and CBC -We will check in with patient later this week to see if she is having any improvement.  Addendum: Iron levels low with ferritin of 9 and hgb of 10. Will order iron transfusion x 2. Will call patient and inform her of this so that it can be scheduled.

## 2020-12-14 NOTE — Patient Instructions (Signed)
Thank you, Ms.Myrtie Neither for allowing [place] to provide your care today. Today we discussed .  Shortness of breath I believe that this is secondary to your continued smoking use as well as your most recent upper respiratory infection.  I do not suspect that this is an exasperation of your bronchitis.  Please continue to use your albuterol inhaler for any wheezing episodes you may have.  Once you have insurance we will refer you to pulmonology for pulmonary function test.  Low iron levels vaginal bleeding We will be checking your iron and hemoglobin levels.  I will call you with these results and if we will give you another iron transfusion. Please continue to take the naproxen as needed for pain.  I have ordered the following labs for you:   Lab Orders         CBC no Diff         Ferritin         Iron and IBC ([id number])       Referrals ordered today:   Referral Orders  No referral(s) requested today     I have ordered the following medication/changed the following medications:   Stop the following medications: Medications Discontinued During This Encounter  Medication Reason   naproxen (NAPROSYN) 500 MG tablet Reorder     Start the following medications: Meds ordered this encounter  Medications   naproxen (NAPROSYN) 500 MG tablet    Sig: Take 1 tablet (500 mg total) by mouth 2 (two) times daily as needed for moderate pain (menstrual cramps).    Dispense:  20 tablet    Refill:  0     Follow up:  If you continue to be short of breath please call our clinic for a follow-up visit     Should you have any questions or concerns please call the internal medicine clinic at [phone].    Sanjuana Letters, D.O. [place]

## 2020-12-14 NOTE — Progress Notes (Signed)
CC: Cough, shortness of breath  HPI:  [name] is a 45 y.o. female with a past medical history stated below and presents today for persistent shortness of breath following an upper respiratory infection. Please see problem based assessment and plan for additional details.  Past Medical History:  Diagnosis Date   Bronchitis    Hypertension    Pruritus 03/29/2018    Current Outpatient Medications on File Prior to Visit  Medication Sig Dispense Refill   albuterol (PROVENTIL HFA;VENTOLIN HFA) 108 (90 Base) MCG/ACT inhaler Inhale 2-4 puffs into the lungs every 4 (four) hours as needed for wheezing or shortness of breath.      amLODipine (NORVASC) 10 MG tablet Take 1 tablet (10 mg total) by mouth daily. 30 tablet 3   cetirizine (ZYRTEC ALLERGY) 10 MG tablet Take 1 tablet (10 mg total) by mouth daily. 30 tablet 2   clobetasol ointment (TEMOVATE) 0.05 % Apply twice a day on affected areas     diclofenac Sodium (VOLTAREN) 1 % GEL Apply 4 g topically 4 (four) times daily. 100 g 0   gabapentin (NEURONTIN) 100 MG capsule Take 1 capsule (100 mg total) by mouth 3 (three) times daily. 90 capsule 2   hydrOXYzine (ATARAX/VISTARIL) 25 MG tablet Take 1- or 2 tablets by mouth at night time for itching. Do not drive under this medication     iron polysaccharides (NU-IRON) 150 MG capsule Take 1 capsule (150 mg total) by mouth daily. 90 capsule 1   nicotine polacrilex (NICORETTE) 2 MG gum Take 1 each (2 mg total) by mouth as needed for smoking cessation. 100 each 0   pantoprazole (PROTONIX) 40 MG tablet Take 1 tablet (40 mg total) by mouth daily. 30 tablet 0   Polysacchar Iron-FA-B12 (FERREX 150 FORTE) 150-1-25 MG-MG-MCG CAPS      terbinafine (LAMISIL) 1 % cream Apply 1 application topically 2 (two) times daily. 30 g 0   triamcinolone (KENALOG) 0.1 % Apply twice a day on affected areas. Do not use on face     Current Facility-Administered Medications on File Prior to Visit  Medication Dose Route  Frequency Provider Last Rate Last Admin   iron dextran complex (INFED) 25 mg in sodium chloride 0.9 % 50 mL IVPB  25 mg Intravenous Once Evette Doffing, Mallie Mussel, MD        Family History  Problem Relation Age of Onset   Cancer Mother    Cancer Father    Cancer Niece     Social History   Socioeconomic History   Marital status: Married    Spouse name: Not on file   Number of children: Not on file   Years of education: Not on file   Highest education level: Not on file  Occupational History   Not on file  Tobacco Use   Smoking status: Every Day    Packs/day: 0.35    Years: 30.00    Pack years: 10.50    Types: Cigarettes   Smokeless tobacco: Never   Tobacco comments:    7cigs  ppd   Vaping Use   Vaping Use: Never used  Substance and Sexual Activity   Alcohol use: Never   Drug use: Never   Sexual activity: Not on file  Other Topics Concern   Not on file  Social History Narrative   Not on file   Social Determinants of Health   Financial Resource Strain: Not on file  Food Insecurity: Not on file  Transportation Needs: Not on  file  Physical Activity: Not on file  Stress: Not on file  Social Connections: Not on file  Intimate Partner Violence: Not on file    Review of Systems: ROS negative except for what is noted on the assessment and plan.  Vitals:   12/14/20 1532  BP: 134/80  Pulse: 74  Temp: 98.2 F (36.8 C)  TempSrc: Oral  SpO2: 99%  Weight: 246 lb 12.8 oz (111.9 kg)  Height: 5\' 6"  (1.676 m)     Physical Exam: Constitutional: Well-appearing, no acute distress HENT: normocephalic atraumatic Eyes: conjunctiva non-erythematous Neck: supple Cardiovascular: regular rate and rhythm, no m/r/g.  No lower extremity edema. Pulmonary/Chest: normal work of breathing on room air, lungs clear to auscultation bilaterally.  Decreased breath sounds bases bilaterally MSK: normal bulk and tone Neurological: alert & oriented x 3 Skin: warm and dry Psych: Normal  mood and thought process   Assessment & Plan:   See Encounters Tab for problem based charting.  Patient discussed with Dr. Caffie Damme, D.O. [facility], PGY-2 Pager: [phone], Phone: [phone] Date 12/14/2020 Time 5:31 PM

## 2020-12-15 LAB — CBC
Hematocrit: 32 % — ABNORMAL LOW (ref 34.0–46.6)
Hemoglobin: 10.1 g/dL — ABNORMAL LOW (ref 11.1–15.9)
MCH: 24.2 pg — ABNORMAL LOW (ref 26.6–33.0)
MCHC: 31.6 g/dL (ref 31.5–35.7)
MCV: 77 fL — ABNORMAL LOW (ref 79–97)
Platelets: 328 10*3/uL (ref 150–450)
RBC: 4.18 x10E6/uL (ref 3.77–5.28)
RDW: 16.7 % — ABNORMAL HIGH (ref 11.7–15.4)
WBC: 7.2 10*3/uL (ref 3.4–10.8)

## 2020-12-15 LAB — IRON AND TIBC
Iron Saturation: 4 % — CL (ref 15–55)
Iron: 18 ug/dL — ABNORMAL LOW (ref 27–159)
Total Iron Binding Capacity: 407 ug/dL (ref 250–450)
UIBC: 389 ug/dL (ref 131–425)

## 2020-12-15 LAB — FERRITIN: Ferritin: 9 ng/mL — ABNORMAL LOW (ref 15–150)

## 2020-12-16 MED ORDER — AMLODIPINE BESYLATE 10 MG PO TABS
10.0000 mg | ORAL_TABLET | Freq: Every day | ORAL | 3 refills | Status: DC
Start: 2020-12-16 — End: 2021-03-18

## 2020-12-16 NOTE — Addendum Note (Signed)
Addended by: Riesa Pope on: 12/16/2020 02:54 PM   Modules accepted: Orders

## 2020-12-16 NOTE — Addendum Note (Signed)
Addended by: Riesa Pope on: 12/16/2020 11:03 AM   Modules accepted: Orders

## 2020-12-21 NOTE — Progress Notes (Signed)
Internal Medicine Clinic Attending  Case discussed with Dr. Katsadouros  At the time of the visit.  We reviewed the resident's history and exam and pertinent patient test results.  I agree with the assessment, diagnosis, and plan of care documented in the resident's note.  

## 2020-12-29 ENCOUNTER — Ambulatory Visit (HOSPITAL_COMMUNITY)
Admission: RE | Admit: 2020-12-29 | Discharge: 2020-12-29 | Disposition: A | Discharge status: Home / Self Care | Payer: Medicaid Other | Source: Ambulatory Visit | Attending: Internal Medicine | Admitting: Internal Medicine

## 2020-12-29 DIAGNOSIS — D509 Iron deficiency anemia, unspecified: Secondary | ICD-10-CM | POA: Insufficient documentation

## 2020-12-29 MED ORDER — SODIUM CHLORIDE 0.9 % IV SOLN
510.0000 mg | Freq: Once | INTRAVENOUS | Status: AC
  Administered 2020-12-29: 510 mg via INTRAVENOUS
  Filled 2020-12-29: qty 17

## 2021-01-05 ENCOUNTER — Encounter (HOSPITAL_COMMUNITY)
Admission: RE | Admit: 2021-01-05 | Discharge: 2021-01-05 | Disposition: A | Discharge status: Home / Self Care | Payer: Self-pay | Source: Ambulatory Visit | Attending: Otolaryngology | Admitting: Otolaryngology

## 2021-01-05 ENCOUNTER — Other Ambulatory Visit: Payer: Self-pay

## 2021-01-05 DIAGNOSIS — D509 Iron deficiency anemia, unspecified: Secondary | ICD-10-CM | POA: Insufficient documentation

## 2021-01-05 MED ORDER — SODIUM CHLORIDE 0.9 % IV SOLN
510.0000 mg | Freq: Once | INTRAVENOUS | Status: AC
  Administered 2021-01-05: 510 mg via INTRAVENOUS
  Filled 2021-01-05: qty 17

## 2021-01-25 IMAGING — CT CT ANGIO NECK
1 of 11 series · 5 of 33 positions shown · IV contrast (omnipaque)
Comparison: None.

CLINICAL DATA: Headache, family history of aneurysm

EXAM:
CT ANGIOGRAPHY HEAD AND NECK
TECHNIQUE: Multidetector CT imaging of the head and neck was performed using
the standard protocol during bolus administration of intravenous
contrast. Multiplanar CT image reconstructions and MIPs were
obtained to evaluate the vascular anatomy. Carotid stenosis
measurements (when applicable) are obtained utilizing NASCET
criteria, using the distal internal carotid diameter as the
denominator.
CONTRAST:  100mL OMNIPAQUE IOHEXOL 350 MG/ML SOLN

[Series 11: ax thin · axial · 0.32mm/px · z∈[+1374,+1588]mm · 5 of 322 slices shown]
[im 54/322  soft-tissue]
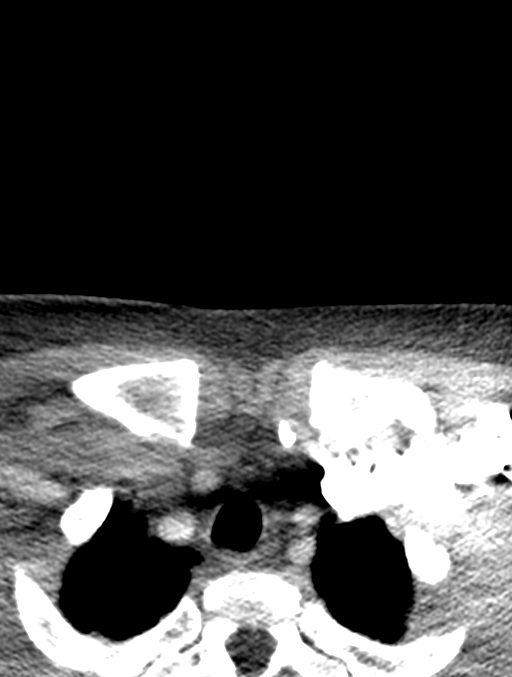
[im 108/322  bone]
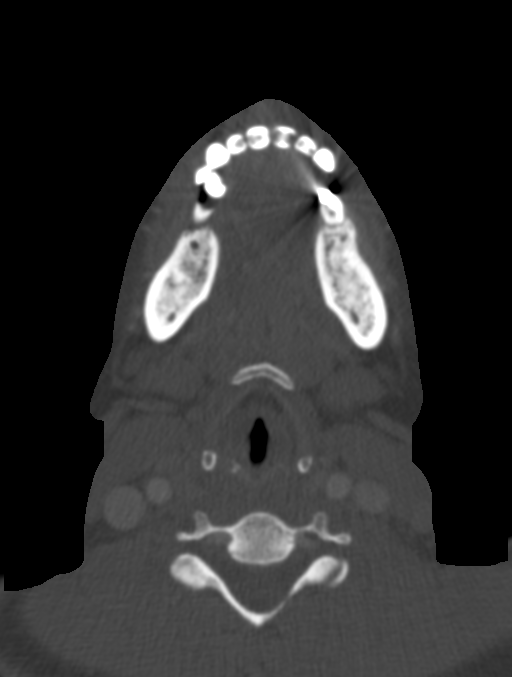
[im 161/322  soft-tissue]
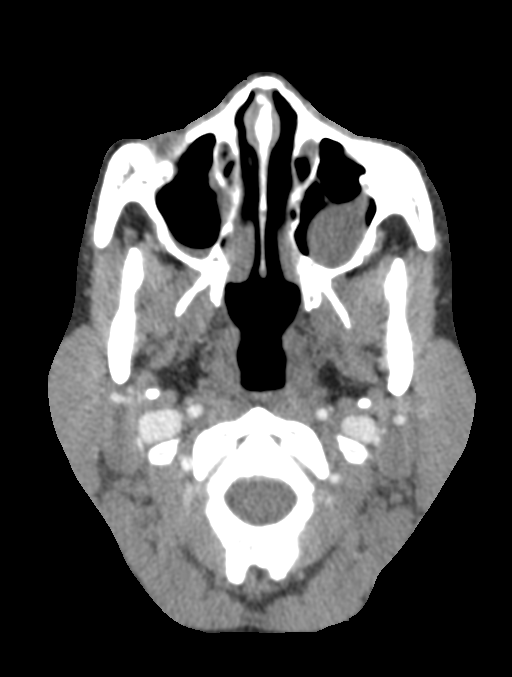
[im 215/322  bone]
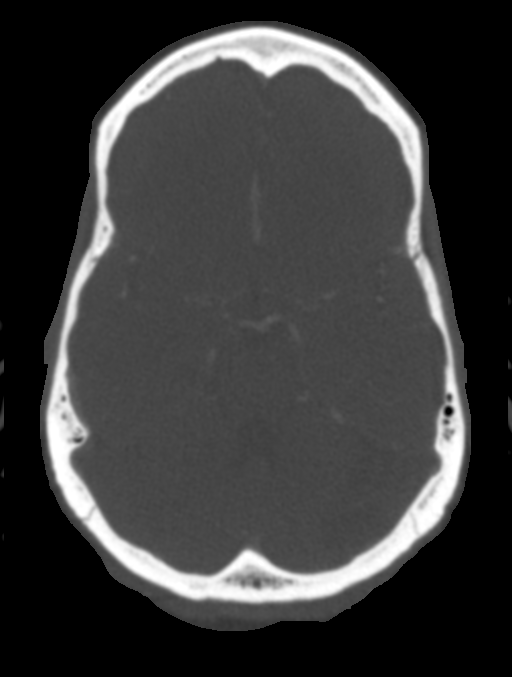
[im 268/322  soft-tissue]
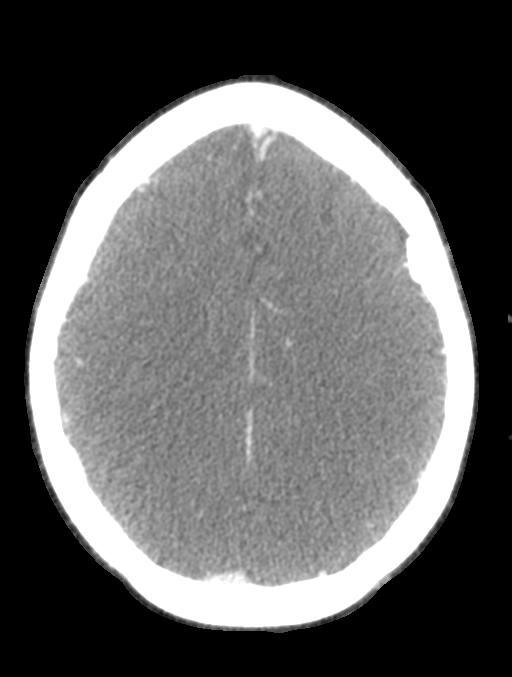

[5 of 33 positions shown; findings below may reference images not displayed]

FINDINGS: CT HEAD

Brain: There is no acute intracranial hemorrhage, mass effect, or
edema. Gray-white differentiation is preserved. Left cerebellar
encephalomalacia probably reflecting prior infarct. There is no
extra-axial fluid collection. Ventricles and sulci are within normal
limits in size and configuration. Partially empty sella.

Vascular: No hyperdense vessel or unexpected calcification.

Skull: Calvarium is unremarkable.

Sinuses/Orbits: Patchy mucosal thickening.  Orbits are unremarkable.

Other: None.

Review of the MIP images confirms the above findings

CTA NECK

Aortic arch: Great vessel origins are patent.

Right carotid system: Patent.  No measurable stenosis.

Left carotid system: Patent.  No measurable stenosis.

Vertebral arteries: Patent and codominant.  No measurable stenosis

Skeleton: Unremarkable.

Other neck: No mass or adenopathy. Subcentimeter partially calcified
thyroid nodules. No further follow-up is recommended per current
guidelines.

Upper chest: No apical lung mass.

Review of the MIP images confirms the above findings

CTA HEAD

Anterior circulation: Intracranial internal carotid arteries are
patent. Anterior and middle cerebral arteries are patent. An
anterior communicating artery is present.

Posterior circulation: Intracranial vertebral arteries, basilar
artery, and posterior cerebral arteries are patent. A right
posterior communicating artery is present.

Venous sinuses: Patent as allowed by contrast bolus timing.

Review of the MIP images confirms the above findings
IMPRESSION: No acute intracranial abnormality. Left cerebellar encephalomalacia
probably reflecting chronic infarct.

No large vessel occlusion, hemodynamically significant stenosis, or
evidence of dissection.

No aneurysm.

## 2021-03-18 ENCOUNTER — Ambulatory Visit (INDEPENDENT_AMBULATORY_CARE_PROVIDER_SITE_OTHER): Payer: BC Managed Care – PPO | Admitting: Internal Medicine

## 2021-03-18 ENCOUNTER — Encounter: Payer: Self-pay | Admitting: Internal Medicine

## 2021-03-18 ENCOUNTER — Other Ambulatory Visit: Payer: Self-pay

## 2021-03-18 ENCOUNTER — Telehealth: Payer: Self-pay | Admitting: *Deleted

## 2021-03-18 VITALS — BP 173/92 | HR 88 | Temp 98.2°F | Resp 24 | Ht 66.0 in | Wt 258.4 lb

## 2021-03-18 DIAGNOSIS — M25441 Effusion, right hand: Secondary | ICD-10-CM | POA: Diagnosis not present

## 2021-03-18 DIAGNOSIS — L03113 Cellulitis of right upper limb: Secondary | ICD-10-CM | POA: Diagnosis not present

## 2021-03-18 DIAGNOSIS — I10 Essential (primary) hypertension: Secondary | ICD-10-CM

## 2021-03-18 MED ORDER — AMLODIPINE BESYLATE 10 MG PO TABS: 10.0000 mg | ORAL_TABLET | Freq: Every day | ORAL | 3 refills | Status: DC

## 2021-03-18 MED ORDER — NAPROXEN 500 MG PO TABS: 500.0000 mg | ORAL_TABLET | Freq: Two times a day (BID) | ORAL | 0 refills | Status: DC

## 2021-03-18 NOTE — Patient Instructions (Addendum)
Ms Cabrini Ruggieri,  It was a pleasure seeing you in clinic. Today we discussed:   Right hand swelling:  Start taking Naproxen 500mg  twice daily. Follow up in 1 week We are checking some lab work today, I will call you with any abnormal results. If symptoms are worsening, please contact us.   If you have any questions or concerns, please call our clinic at [phone] between 9am-5pm and after hours call [phone] and ask for the internal medicine resident on call. If you feel you are having a medical emergency please call 911.   Thank you, we look forward to helping you remain healthy!

## 2021-03-18 NOTE — Progress Notes (Signed)
° °  CC: right hand swelling  HPI:  [name] is a 46 y.o. female with PMHx as stated below presenting for evaluation of right hand swelling for three days of right hand swelling and with numbness/tingling. Please see problem based charting for complete assessment and plan.   Past Medical History:  Diagnosis Date   Bronchitis    Hypertension    Pruritus 03/29/2018   Review of Systems:  Negative except as stated in HPI.  Physical Exam:  Vitals:   03/18/21 1612  BP: (!) 173/92  Pulse: 88  Resp: (!) 24  Temp: 98.2 F (36.8 C)  TempSrc: Oral  SpO2: 100%  Weight: 258 lb 6.4 oz (117.2 kg)  Height: 5\' 6"  (1.676 m)   Physical Exam  Constitutional: Appears well-developed and well-nourished. No distress.  HENT: Normocephalic and atraumatic Cardiac: RRR, S1 and S2 present, no m/r/g, distal pulses intact  Respiratory: Effort normal on room air. Lungs CTAB Musculoskeletal: Normal bulk and tone. Mild nonpitting edema of the right hand, mostly surrounding the 2nd MTP joint, with overlying erythema and tenderness, with decreased flexion of the 2nd digit  Neurological: Is alert and oriented x4, no apparent focal deficits noted. Skin: Warm and dry.  Palmar psoriasis stable without appearance of acute flare.  Psychiatric: Normal mood and affect.      Assessment & Plan:   See Encounters Tab for problem based charting.  Patient discussed with Dr.  Cain Sieve

## 2021-03-18 NOTE — Telephone Encounter (Signed)
Patient called Martha Garza stating she is unable to upload picture to [facility]. She is requesting to come to Clinic for someone to look at hand. Discussed with Dr. Marva Panda who agrees to this. Patient will come to clinic directly after work.

## 2021-03-18 NOTE — Telephone Encounter (Signed)
Patient called in stating the top of right hand is swelling like it did last July. Please see notes from [facility] on 08/11/20. She was given cephalexin 500 mg QID and voltaren gel at that time. She made the first available appt but it's not till 2/22. 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 She is requesting refill on cephalexin and voltaren gel. She will try to upload a picture of her hand to [facility].

## 2021-03-19 LAB — C-REACTIVE PROTEIN: CRP: 3 mg/L (ref 0–10)

## 2021-03-19 LAB — RHEUMATOID FACTOR: Rheumatoid fact SerPl-aCnc: <10 IU/mL (ref <14.0)

## 2021-03-19 LAB — SEDIMENTATION RATE: Sed Rate: 34 mm/hr — ABNORMAL HIGH (ref 0–32)

## 2021-03-19 NOTE — Assessment & Plan Note (Addendum)
Patient is presenting with swelling of her right hand, most significantly at the 2nd MTP joint with surrounding erythema and tenderness. She reports three days of symptoms associated with paresthesias in the same hand. She reports that her job involves a lot of typing and suspects that this may be from carpal tunnel syndrome. On examination, she has mild nonpitting edema of the right hand without proximal spread. She has significant tenderness to palpation of the second MTP joint with decreased flexion of the second digit. She denies any recent trauma or bug bite or any other joint pains. No tenderness or restriction of movement noted in the wrist. Of note, patient had similar presentation in July 2022 with right hand swelling that extended to her wrist. It was suspected to be secondary to cellulitis and patient was prescribed Keflex with improvement in symptoms. Patient reports that her current episode feels similar to prior.  On exam, does not appear to have evidence of cellulitis at this time. Given her history of similar occurrence approximately 7 months prior, and involvement of the MTP joint, concern for possible rheumatologic etiology, given her history of psoriasis. ESR, CRP and RF obtained; however, negative at this time for acute inflammation.   Plan: Trial of naproxen 586m bid If no significant improvement or worsening of symptoms, will follow up in clinic for further evaluation

## 2021-03-19 NOTE — Assessment & Plan Note (Signed)
BP Readings from Last 3 Encounters:  03/18/21 (!) 173/92  01/05/21 (!) 148/75  12/29/20 (!) 172/87   Patient noted to have elevated BP at this visit. Asymptomatic at this time. She reports that she has been out of her amlodipine and is requesting a refill. Patient advised to take antihypertensive and keep blood pressure log at home and bring to next visit.   Plan: Refilled amlodipine 10mg  daily Follow up in 1 week for BP check

## 2021-03-20 NOTE — Progress Notes (Signed)
Internal Medicine Clinic Attending  I saw and evaluated the patient.  I personally confirmed the key portions of the history and exam documented by Dr. Marva Panda and I reviewed pertinent patient test results.  The assessment, diagnosis, and plan were formulated together and I agree with the documentation in the residents note.   I agree with Dr. Margy Clarks assessment that this does not seem like a cellulitis. There doesn't appear to be enough fluid around to joint to tap it, but if fluid increases in the future, I think doing a tap would be helpful to look for cells & crystals. Will do trial of NSAID for now, with close follow-up if not improving.

## 2021-03-24 ENCOUNTER — Encounter: Payer: Medicaid Other | Admitting: Internal Medicine

## 2021-03-25 ENCOUNTER — Encounter: Payer: Self-pay | Admitting: Student

## 2021-05-14 ENCOUNTER — Telehealth: Payer: Self-pay

## 2021-05-14 ENCOUNTER — Telehealth: Payer: Self-pay | Admitting: *Deleted

## 2021-05-14 ENCOUNTER — Other Ambulatory Visit: Payer: Self-pay | Admitting: Internal Medicine

## 2021-05-14 DIAGNOSIS — N946 Dysmenorrhea, unspecified: Secondary | ICD-10-CM

## 2021-05-14 MED ORDER — NAPROXEN 500 MG PO TABS: 500.0000 mg | ORAL_TABLET | Freq: Two times a day (BID) | ORAL | 0 refills | Status: AC

## 2021-05-14 NOTE — Telephone Encounter (Signed)
Pt's calling for refill on Naproxen for menstrual cramps. ? ?naproxen (NAPROSYN) 500 MG tablet [[id number]]  ?  Order Details ?Dose: 500 mg Route: Oral Frequency: 2 times daily with meals  ?Dispense Quantity: 28 tablet Refills: 0   ?     ?Sig: Take 1 tablet (500 mg total) by mouth 2 (two) times daily with a meal   ? ?

## 2021-05-14 NOTE — Telephone Encounter (Signed)
Pt was called regarding naproxen rx refill- no answer; left message on self-identified vm. ?

## 2021-05-14 NOTE — Telephone Encounter (Signed)
ERROR

## 2021-06-24 ENCOUNTER — Encounter: Payer: BC Managed Care – PPO | Admitting: Student

## 2021-06-24 ENCOUNTER — Telehealth: Payer: Self-pay | Admitting: *Deleted

## 2021-06-24 ENCOUNTER — Encounter: Payer: Self-pay | Admitting: Student

## 2021-06-24 NOTE — Telephone Encounter (Signed)
Called patient left voice message regarding missed appointment/ lvm for patient to contact our office is she is wanting to reschedule appointment to contact us at [phone].

## 2021-09-02 ENCOUNTER — Telehealth: Payer: Self-pay

## 2021-09-02 MED ORDER — NAPROXEN 500 MG PO TABS: ORAL_TABLET | ORAL | 0 refills | Status: DC

## 2021-09-02 NOTE — Telephone Encounter (Signed)
Needs refill on Naprosyn for her period cramps.

## 2021-09-02 NOTE — Telephone Encounter (Signed)
Refill sent.

## 2021-09-02 NOTE — Telephone Encounter (Signed)
Requesting refill on naproxen @ [facility], [place].

## 2021-09-23 ENCOUNTER — Ambulatory Visit (INDEPENDENT_AMBULATORY_CARE_PROVIDER_SITE_OTHER): Payer: BC Managed Care – PPO | Admitting: Internal Medicine

## 2021-09-23 VITALS — BP 139/69 | HR 68 | Temp 98.3°F | Wt 247.3 lb

## 2021-09-23 DIAGNOSIS — K219 Gastro-esophageal reflux disease without esophagitis: Secondary | ICD-10-CM

## 2021-09-23 DIAGNOSIS — F17210 Nicotine dependence, cigarettes, uncomplicated: Secondary | ICD-10-CM

## 2021-09-23 DIAGNOSIS — J4540 Moderate persistent asthma, uncomplicated: Secondary | ICD-10-CM

## 2021-09-23 DIAGNOSIS — I10 Essential (primary) hypertension: Secondary | ICD-10-CM

## 2021-09-23 DIAGNOSIS — D509 Iron deficiency anemia, unspecified: Secondary | ICD-10-CM

## 2021-09-23 DIAGNOSIS — R011 Cardiac murmur, unspecified: Secondary | ICD-10-CM

## 2021-09-23 MED ORDER — ALBUTEROL SULFATE HFA 108 (90 BASE) MCG/ACT IN AERS: 2.0000 | INHALATION_SPRAY | RESPIRATORY_TRACT | 2 refills | Status: AC | PRN

## 2021-09-23 MED ORDER — AMLODIPINE BESYLATE 10 MG PO TABS: 10.0000 mg | ORAL_TABLET | Freq: Every day | ORAL | 3 refills | Status: AC

## 2021-09-23 MED ORDER — PANTOPRAZOLE SODIUM 40 MG PO TBEC: 40.0000 mg | DELAYED_RELEASE_TABLET | Freq: Every day | ORAL | 0 refills | Status: AC

## 2021-09-23 NOTE — Assessment & Plan Note (Signed)
BP initially uncontrolled at 151/75, improved on recheck 139/69. Current regimen is amlodipine 10 mg daily. Plan:Continue amlodipine 10 mg daily.

## 2021-09-23 NOTE — Assessment & Plan Note (Addendum)
Patient presents today with complaints of two weeks of dyspnea, severe fatigue despite adequate rest, some dizziness, and pica. She has a history of severe iron deficiency requiring iron infusions. She experiences very heavy menstrual cycles and is being evaluated for next steps by OB/Gyn, though she does request a referral for a new physician for a second opinion. She denies fever, chills, cough, sinus drainage, recent illness. Plan: CBC, iron studies ordered. Referral placed for new OB/Gyn for second opinion.

## 2021-09-23 NOTE — Progress Notes (Signed)
   CC: shortness of breath  HPI:  [name] is a 46 y.o. person with past medical history as detailed below who presents today complaining of shortness of breath and requesting medication refills. Please see problem based charting for detailed assessment and plan.  Past Medical History:  Diagnosis Date   Bronchitis    Hypertension    Pruritus 03/29/2018   Review of Systems:  Negative unless otherwise stated.  Physical Exam:  Vitals:   09/23/21 1551 09/23/21 1624  BP: (!) 151/75 139/69  Pulse: 76 68  Temp: 98.3 F (36.8 C)   TempSrc: Oral   SpO2: 100%   Weight: 247 lb 4.8 oz (112.2 kg)    Constitutional:Appears stated age. In no acute distress. Cardio:Regular rate and rhythm. Grade 2/6 systolic murmur best appreciated over L upper sternal border. Pulm:Clear to auscultation bilaterally. Normal work of breathing on room air. PVX:[id number] for extremity edema. Skin:Warm and dry. Mucosal pallor on conjunctiva and oropharynx. Skin is particularly dry and scaly over bilateral palms. Neuro:Alert and oriented x3. No focal deficit noted. Psych:Pleasant mood and affect.   Assessment & Plan:   See Encounters Tab for problem based charting.  Moderate persistent asthma without complication Patient requests refill for albuterol. Plan:Refill sent.   Hypertension BP initially uncontrolled at 151/75, improved on recheck 139/69. Current regimen is amlodipine 10 mg daily. Plan:Continue amlodipine 10 mg daily.  Iron deficiency anemia Patient presents today with complaints of two weeks of dyspnea, severe fatigue despite adequate rest, some dizziness, and pica. She has a history of severe iron deficiency requiring iron infusions. She experiences very heavy menstrual cycles and is being evaluated for next steps by OB/Gyn, though she does request a referral for a new physician for a second opinion. She denies fever, chills, cough, sinus drainage, recent illness. Plan: CBC, iron studies  ordered. Referral placed for new OB/Gyn for second opinion.  Gastroesophageal reflux disease Patient is requesting refill on Protonix for symptomatic GERD. Plan:Refill sent.  Heart murmur Systolic 2/6 murmur appreciated on exam, loudest at L upper sternal border. Patient explains that she was told as a child that she had a murmur however this has never to her knowledge been formally assessed. Assessment:While her symptoms today could be related to a heart murmur, given her history of iron deficiency anemia requiring iron infusions, it is more likely due to recurrence of this process. Plan:Refer to cardiology and obtain echocardiogram if there is change in the murmur or if she remains symptomatic despite treating underling IDA.  Patient discussed with Dr.  Cain Sieve

## 2021-09-23 NOTE — Patient Instructions (Signed)
Martha Garza,  It was nice seeing you today! Thank you for choosing [facility] for your Primary Care.    Today we talked about:   Shortness of breath: I am checking your blood counts and iron numbers. I will let you know once I get those results back if we need to do another iron infusion. I have also placed a referral to OB/Gyn for you to get a second opinion as you requested.   If you do require additional iron, I will want you to return to clinic in 4 weeks from the infusion.   My best, Dr. Marlou Sa

## 2021-09-23 NOTE — Assessment & Plan Note (Signed)
Systolic 2/6 murmur appreciated on exam, loudest at L upper sternal border. Patient explains that she was told as a child that she had a murmur however this has never to her knowledge been formally assessed. Assessment:While her symptoms today could be related to a heart murmur, given her history of iron deficiency anemia requiring iron infusions, it is more likely due to recurrence of this process. Plan:Refer to cardiology and obtain echocardiogram if there is change in the murmur or if she remains symptomatic despite treating underling IDA.

## 2021-09-23 NOTE — Assessment & Plan Note (Signed)
Patient is requesting refill on Protonix for symptomatic GERD. Plan:Refill sent.

## 2021-09-23 NOTE — Assessment & Plan Note (Signed)
Patient requests refill for albuterol. Plan:Refill sent.

## 2021-09-24 ENCOUNTER — Encounter (HOSPITAL_COMMUNITY): Payer: Self-pay | Admitting: Emergency Medicine

## 2021-09-24 ENCOUNTER — Other Ambulatory Visit: Payer: Self-pay

## 2021-09-24 ENCOUNTER — Observation Stay (HOSPITAL_COMMUNITY)
Admission: EM | Admit: 2021-09-24 | Discharge: 2021-09-25 | Disposition: A | Discharge status: Home / Self Care | Payer: BC Managed Care – PPO | Attending: Internal Medicine | Admitting: Internal Medicine

## 2021-09-24 DIAGNOSIS — I10 Essential (primary) hypertension: Secondary | ICD-10-CM | POA: Diagnosis present

## 2021-09-24 DIAGNOSIS — R7989 Other specified abnormal findings of blood chemistry: Secondary | ICD-10-CM | POA: Diagnosis present

## 2021-09-24 DIAGNOSIS — Z79899 Other long term (current) drug therapy: Secondary | ICD-10-CM | POA: Insufficient documentation

## 2021-09-24 DIAGNOSIS — N939 Abnormal uterine and vaginal bleeding, unspecified: Secondary | ICD-10-CM | POA: Diagnosis not present

## 2021-09-24 DIAGNOSIS — D649 Anemia, unspecified: Secondary | ICD-10-CM | POA: Diagnosis present

## 2021-09-24 DIAGNOSIS — D509 Iron deficiency anemia, unspecified: Principal | ICD-10-CM | POA: Diagnosis present

## 2021-09-24 DIAGNOSIS — F17210 Nicotine dependence, cigarettes, uncomplicated: Secondary | ICD-10-CM | POA: Insufficient documentation

## 2021-09-24 DIAGNOSIS — D259 Leiomyoma of uterus, unspecified: Secondary | ICD-10-CM

## 2021-09-24 DIAGNOSIS — K219 Gastro-esophageal reflux disease without esophagitis: Secondary | ICD-10-CM | POA: Diagnosis not present

## 2021-09-24 LAB — CBC
HCT: 23.5 % — ABNORMAL LOW (ref 36.0–46.0)
Hematocrit: 24.8 % — ABNORMAL LOW (ref 34.0–46.6)
Hemoglobin: 6.8 g/dL — CL (ref 11.1–15.9)
Hemoglobin: 6.3 g/dL — CL (ref 12.0–15.0)
MCH: 17 pg — ABNORMAL LOW (ref 26.6–33.0)
MCH: 17.1 pg — ABNORMAL LOW (ref 26.0–34.0)
MCHC: 27.4 g/dL — ABNORMAL LOW (ref 31.5–35.7)
MCHC: 26.8 g/dL — ABNORMAL LOW (ref 30.0–36.0)
MCV: 62 fL — ABNORMAL LOW (ref 79–97)
MCV: 63.7 fL — ABNORMAL LOW (ref 80.0–100.0)
Platelets: 314 10*3/uL (ref 150–450)
Platelets: 285 10*3/uL (ref 150–400)
RBC: 4 x10E6/uL (ref 3.77–5.28)
RBC: 3.69 MIL/uL — ABNORMAL LOW (ref 3.87–5.11)
RDW: 17.2 % — ABNORMAL HIGH (ref 11.7–15.4)
RDW: 18.3 % — ABNORMAL HIGH (ref 11.5–15.5)
WBC: 6.6 10*3/uL (ref 3.4–10.8)
WBC: 5.9 10*3/uL (ref 4.0–10.5)
nRBC: 0 % (ref 0.0–0.2)

## 2021-09-24 LAB — BASIC METABOLIC PANEL
Anion gap: 6 (ref 5–15)
BUN: 8 mg/dL (ref 6–20)
CO2: 26 mmol/L (ref 22–32)
Calcium: 9.2 mg/dL (ref 8.9–10.3)
Chloride: 107 mmol/L (ref 98–111)
Creatinine, Ser: 0.74 mg/dL (ref 0.44–1.00)
GFR, Estimated: >60 mL/min (ref >60)
Glucose, Bld: 99 mg/dL (ref 70–99)
Potassium: 4 mmol/L (ref 3.5–5.1)
Sodium: 139 mmol/L (ref 135–145)

## 2021-09-24 LAB — IRON,TIBC AND FERRITIN PANEL
Ferritin: 4 ng/mL — ABNORMAL LOW (ref 15–150)
Iron Saturation: 2 % — CL (ref 15–55)
Iron: 10 ug/dL — ABNORMAL LOW (ref 27–159)
Total Iron Binding Capacity: 449 ug/dL (ref 250–450)
UIBC: 439 ug/dL — ABNORMAL HIGH (ref 131–425)

## 2021-09-24 LAB — HEMOGLOBIN AND HEMATOCRIT, BLOOD
HCT: 24.5 % — ABNORMAL LOW (ref 36.0–46.0)
Hemoglobin: 6.9 g/dL — CL (ref 12.0–15.0)

## 2021-09-24 LAB — PREPARE RBC (CROSSMATCH)

## 2021-09-24 MED ORDER — ALBUTEROL SULFATE (2.5 MG/3ML) 0.083% IN NEBU: 2.5000 mg | INHALATION_SOLUTION | RESPIRATORY_TRACT | Status: DC | PRN

## 2021-09-24 MED ORDER — AMLODIPINE BESYLATE 5 MG PO TABS
10.0000 mg | ORAL_TABLET | Freq: Every day | ORAL | Status: DC
  Administered 2021-09-24 – 2021-09-25 (×2): 10 mg via ORAL
  Filled 2021-09-24 (×2): qty 2

## 2021-09-24 MED ORDER — SODIUM CHLORIDE 0.9% IV SOLUTION: Freq: Once | INTRAVENOUS | Status: AC

## 2021-09-24 MED ORDER — MEGESTROL ACETATE 40 MG PO TABS
40.0000 mg | ORAL_TABLET | Freq: Two times a day (BID) | ORAL | Status: DC
  Administered 2021-09-24: 40 mg via ORAL
  Filled 2021-09-24 (×2): qty 1

## 2021-09-24 MED ORDER — NAPROXEN 500 MG PO TABS
500.0000 mg | ORAL_TABLET | Freq: Every day | ORAL | Status: DC | PRN
Start: 2021-09-24 — End: 2021-09-25
  Administered 2021-09-24 – 2021-09-25 (×2): 500 mg via ORAL
  Filled 2021-09-24 (×2): qty 1

## 2021-09-24 MED ORDER — FERROUS SULFATE 325 (65 FE) MG PO TABS
325.0000 mg | ORAL_TABLET | Freq: Every day | ORAL | Status: DC
  Administered 2021-09-25: 325 mg via ORAL
  Filled 2021-09-24: qty 1

## 2021-09-24 MED ORDER — PANTOPRAZOLE SODIUM 40 MG PO TBEC
40.0000 mg | DELAYED_RELEASE_TABLET | Freq: Every day | ORAL | Status: DC
  Administered 2021-09-25: 40 mg via ORAL
  Filled 2021-09-24: qty 1

## 2021-09-24 MED ORDER — SODIUM CHLORIDE 0.9% IV SOLUTION
Freq: Once | INTRAVENOUS | Status: AC
Start: 2021-09-24 — End: 2021-09-24

## 2021-09-24 MED ORDER — MEGESTROL ACETATE 40 MG PO TABS
40.0000 mg | ORAL_TABLET | Freq: Every day | ORAL | Status: DC
  Filled 2021-09-24: qty 1

## 2021-09-24 MED ORDER — FERUMOXYTOL INJECTION 510 MG/17 ML
510.0000 mg | Freq: Once | INTRAVENOUS | Status: AC
  Administered 2021-09-24: 510 mg via INTRAVENOUS
  Filled 2021-09-24: qty 17

## 2021-09-24 NOTE — H&P (Signed)
History and Physical    Martha Garza FUX:[id number] DOB: Dec 09, 1975 DOA: 09/24/2021  PCP: Riesa Pope, MD   Patient coming from: Home    Chief Complaint: Vaginal bleeding, abnormal labs, lightheadedness, dizziness  HPI: Martha Garza is a 46 y.o. female with medical history significant of uterine fibroid causing abnormal uterine bleed, hypertension, iron deficiency who presented to the emergency department after she was found to have low hemoglobin.  She  was feeling weak, fatigue for last few weeks.  Patient was seen by internal medicine clinic yesterday, outpatient labs showed hemoglobin of 6.8 and she was sent to the emergency department for blood transfusion. Patient seen and examined at the bedside in the emergency department.  Currently, she is hemodynamically stable, mildly hypertensive.  Complains of mild abdominal discomfort.  She gets menses every month but the last longer generally for 7 days and tend to be heavy. Patient was following with GYN at [facility], and was planned for surgery but she did not follow-up because she does not like the provider. Patient denies any fever, chills, chest pain, nausea, vomiting, dysuria, hematochezia or melena.  ED Course: Blood pressure remains on the higher side.  She was transfused with a unit of PRBC but hemoglobin is still below 7 so was transfused another unit.  GYN consulted.  We were asked for admission because patient was symptomatic and was not able to ambulate, feeling lightheaded and dizzy. Iron studies done on 8/24 showed iron of 10, ordered IV iron.  Review of Systems: As per HPI otherwise 10 point review of systems negative.    Past Medical History:  Diagnosis Date   Bronchitis    Hypertension    Pruritus 03/29/2018    History reviewed. No pertinent surgical history.   reports that she has been smoking cigarettes. She has a 10.50 pack-year smoking history. She has never used smokeless tobacco. She reports that  she does not drink alcohol and does not use drugs.  Allergies  Allergen Reactions   Elimite [Permethrin] Hives   Losartan Hives   Penicillins Hives    Did it involve swelling of the face/tongue/throat, SOB, or low BP? No Did it involve sudden or severe rash/hives, skin peeling, or any reaction on the inside of your mouth or nose? Yes Did you need to seek medical attention at a hospital or doctor's office? Unknown When did it last happen?  Childhood     If all above answers are "NO", may proceed with cephalosporin use.      Family History  Problem Relation Age of Onset   Cancer Mother    Cancer Father    Cancer Niece      Prior to Admission medications   Medication Sig Start Date End Date Taking? Authorizing Provider  amLODipine (NORVASC) 10 MG tablet Take 1 tablet (10 mg total) by mouth daily. 09/23/21  Yes Farrel Gordon, DO  clobetasol ointment (TEMOVATE) 0.25 % Apply 1 Application topically daily as needed (rash).   Yes [provider]  naproxen (NAPROSYN) 500 MG tablet Take 500 mg by mouth daily as needed for moderate pain.   Yes [provider]  albuterol (VENTOLIN HFA) 108 (90 Base) MCG/ACT inhaler Inhale 2-4 puffs into the lungs every 4 (four) hours as needed for wheezing or shortness of breath. 09/23/21   Farrel Gordon, DO  diclofenac Sodium (VOLTAREN) 1 % GEL Apply 4 g topically 4 (four) times daily. Patient not taking: Reported on 09/24/2021 08/11/20   Riesa Pope, MD  pantoprazole ([place])  40 MG tablet Take 1 tablet (40 mg total) by mouth daily. 09/23/21 10/23/21  Farrel Gordon, DO  terbinafine (LAMISIL) 1 % cream Apply 1 application topically 2 (two) times daily. Patient not taking: Reported on 09/24/2021 04/03/20   Alexandria Lodge, MD    Physical Exam: Vitals:   09/24/21 1600 09/24/21 1625 09/24/21 1630 09/24/21 1700  BP: (!) 153/109  (!) 149/84 (!) 158/83  Pulse: 79  66 66  Resp: 19  18 (!) 22  Temp:  98.5 F (36.9 C)    TempSrc:  Oral    SpO2:  99%  99% 99%    Constitutional: NAD, calm, comfortable,obese Vitals:   09/24/21 1600 09/24/21 1625 09/24/21 1630 09/24/21 1700  BP: (!) 153/109  (!) 149/84 (!) 158/83  Pulse: 79  66 66  Resp: 19  18 (!) 22  Temp:  98.5 F (36.9 C)    TempSrc:  Oral    SpO2: 99%  99% 99%   Eyes: PERRL, lids and conjunctivae normal ENMT: Mucous membranes are moist.  Neck: normal, supple, no masses, no thyromegaly Respiratory: clear to auscultation bilaterally, no wheezing, no crackles. Normal respiratory effort. No accessory muscle use.  Cardiovascular: Regular rate and rhythm, no  rubs / gallops. No extremity edema.Murmur not heard during my exam  Abdomen: mild lower abdominal tenderness on deep palpation, no masses palpated. No hepatosplenomegaly. Bowel sounds positive.  Musculoskeletal: no clubbing / cyanosis. No joint deformity upper and lower extremities.  Skin: no rashes, lesions, ulcers. No induration.[place] Neurologic: CN 2-12 grossly intact.  Strength 5/5 in all 4.  Psychiatric: Normal judgment and insight. Alert and oriented x 3. Normal mood.   Foley Catheter:None  Labs on Admission: I have personally reviewed following labs and imaging studies  CBC: Recent Labs  Lab 09/23/21 1640 09/24/21 1006 09/24/21 1610  WBC 6.6 5.9  --   HGB 6.8* 6.3* 6.9*  HCT 24.8* 23.5* 24.5*  MCV 62* 63.7*  --   PLT 314 285  --    Basic Metabolic Panel: No results for input(s): "NA", "K", "CL", "CO2", "GLUCOSE", "BUN", "CREATININE", "CALCIUM", "MG", "PHOS" in the last 168 hours. GFR: CrCl cannot be calculated (Patient's most recent lab result is older than the maximum 21 days allowed.). Liver Function Tests: No results for input(s): "AST", "ALT", "ALKPHOS", "BILITOT", "PROT", "ALBUMIN" in the last 168 hours. No results for input(s): "LIPASE", "AMYLASE" in the last 168 hours. No results for input(s): "AMMONIA" in the last 168 hours. Coagulation Profile: No results for input(s): "INR", "PROTIME" in  the last 168 hours. Cardiac Enzymes: No results for input(s): "CKTOTAL", "CKMB", "CKMBINDEX", "TROPONINI" in the last 168 hours. BNP (last 3 results) No results for input(s): "PROBNP" in the last 8760 hours. HbA1C: No results for input(s): "HGBA1C" in the last 72 hours. CBG: No results for input(s): "GLUCAP" in the last 168 hours. Lipid Profile: No results for input(s): "CHOL", "HDL", "LDLCALC", "TRIG", "CHOLHDL", "LDLDIRECT" in the last 72 hours. Thyroid Function Tests: No results for input(s): "TSH", "T4TOTAL", "FREET4", "T3FREE", "THYROIDAB" in the last 72 hours. Anemia Panel: Recent Labs    09/23/21 1640  FERRITIN 4*  TIBC 449  IRON 10*   Urine analysis:    Component Value Date/Time   COLORURINE YELLOW 05/27/2019 [place] 05/27/2019 1434   LABSPEC 1.019 05/27/2019 1434   PHURINE 5.0 05/27/2019 1434   [place] 05/27/2019 1434   [place] 05/27/2019 [place] 05/27/2019 1434   [place] 05/27/2019 1434  PROTEINUR 30 (A) 05/27/2019 1434   NITRITE NEGATIVE 05/27/2019 1434   LEUKOCYTESUR TRACE (A) 05/27/2019 1434    Radiological Exams on Admission: No results found.   Assessment/Plan Principal Problem:   Symptomatic anemia Active Problems:   Iron deficiency anemia   Hypertension   Gastroesophageal reflux disease   Uterine fibroid  Symptomatic anemia: From menorrhagia.  History of uterine fibroid. Transfused with a unit of PRBC but hemoglobin still below 7, ordered another blood transfusion.  Monitor CBC. Patient was complaining of 2 weeks history of fatigue, dyspnea, dizziness.  Uterine fibroid: Has history of longstanding abnormal uterine bleeding in the setting of fibroids.  Ultrasound done at [facility] showed multiple large fibroids, she was planned for surgery but she did not show up.  Consult was placed with OB/GYN here.  Recommend to start Megace at 40 mg twice daily.  Outpatient follow-up  arranged with [facility] in 4 weeks.  Iron deficiency: Secondary to blood loss from vaginal bleeding.  She gets intermittent outpatient IV iron infusion.  Low iron, given a dose of IV iron.  Continue oral iron supplementation  Hypertension: Blood pressure elevated in the emergency department.  Takes amlodipine 10 mg daily at home which we will continue.  Blood pressure will be monitored   GERD: Continue Protonix   Severity of Illness: The appropriate patient status for this patient is OBSERVATION. Observation status is judged to be reasonable and necessary in order to provide the required intensity of service to ensure the patient's safety. The patient's presenting symptoms, physical exam findings, and initial radiographic and laboratory data in the context of their medical condition is felt to place them at decreased risk for further clinical deterioration. Furthermore, it is anticipated that the patient will be medically stable for discharge from the hospital within 2 midnights of admission.    DVT prophylaxis: SCD Code Status: Full Family Communication: None at bedside Consults called: ED called GYN     Shelly Coss MD [facility]  09/24/2021, 5:18 PM

## 2021-09-24 NOTE — ED Provider Notes (Signed)
[facility] DEPT Provider Note   CSN: [id number] Arrival date & time: 09/24/21  4098     History Chief Complaint  Patient presents with   Abnomal Lab    HPI Martha Garza is a 46 y.o. female presenting for abnormal labs in the outpatient setting.  Patient had outpatient labs notable for a hemoglobin of 6.  Patient is interested in a transfusion.  Patient has a history of hypertension on amlodipine, IDA with heavy menstrual bleeding, GERD.  Patient states that she has been feeling fatigued and weak over the past few weeks. Patient states that she followed up with gynecology in the outpatient setting and has known fibroids.  They are considering ablation versus hysterectomy, however patient like to get a second opinion.  She was seen by her PCP yesterday and had outpatient labs ordered notable for recurrent anemia.  Symptomatically, patient does have some lightheadedness and fatigue but otherwise is ambulatory tolerating p.o. intake. She denies fevers or chills nausea vomiting, syncope or shortness of breath at this time.   Patient's recorded medical, surgical, social, medication list and allergies were reviewed in the Snapshot window as part of the initial history.   Review of Systems   Review of Systems  Constitutional:  Negative for chills and fever.  HENT:  Negative for ear pain and sore throat.   Eyes:  Negative for pain and visual disturbance.  Respiratory:  Negative for cough and shortness of breath.   Cardiovascular:  Negative for chest pain and palpitations.  Gastrointestinal:  Negative for abdominal pain and vomiting.  Genitourinary:  Negative for dysuria and hematuria.  Musculoskeletal:  Negative for arthralgias and back pain.  Skin:  Negative for color change and rash.  Neurological:  Positive for light-headedness. Negative for seizures and syncope.  All other systems reviewed and are negative.   Physical Exam Updated Vital Signs BP (!)  158/83   Pulse 66   Temp 98.5 F (36.9 C) (Oral)   Resp (!) 22   SpO2 99%  Physical Exam Vitals and nursing note reviewed.  Constitutional:      General: She is not in acute distress.    Appearance: She is well-developed.  HENT:     Head: Normocephalic and atraumatic.  Eyes:     Conjunctiva/sclera: Conjunctivae normal.  Cardiovascular:     Rate and Rhythm: Normal rate and regular rhythm.     Heart sounds: No murmur heard. Pulmonary:     Effort: Pulmonary effort is normal. No respiratory distress.     Breath sounds: Normal breath sounds.  Abdominal:     Palpations: Abdomen is soft.     Tenderness: There is no abdominal tenderness.  Musculoskeletal:        General: No swelling.     Cervical back: Neck supple.  Skin:    General: Skin is warm and dry.     Capillary Refill: Capillary refill takes less than 2 seconds.  Neurological:     Mental Status: She is alert.  Psychiatric:        Mood and Affect: Mood normal.      ED Course/ Medical Decision Making/ A&P    Procedures .Critical Care  Performed by: Tretha Sciara, MD Authorized by: Tretha Sciara, MD   Critical care provider statement:    Critical care time (minutes):  30   Critical care was necessary to treat or prevent imminent or life-threatening deterioration of the following conditions:  Circulatory failure   Critical care was time spent  personally by me on the following activities:  Development of treatment plan with patient or surrogate, discussions with consultants, evaluation of patient's response to treatment, examination of patient, ordering and review of laboratory studies, ordering and review of radiographic studies, ordering and performing treatments and interventions, pulse oximetry, re-evaluation of patient's condition and review of old charts    Medications Ordered in ED Medications  0.9 %  sodium chloride infusion (Manually program via Guardrails IV Fluids) (has no administration in time  range)  megestrol (MEGACE) tablet 40 mg (has no administration in time range)  ferumoxytol (FERAHEME) 510 mg in sodium chloride 0.9 % 100 mL IVPB (has no administration in time range)  ferrous sulfate tablet 325 mg (has no administration in time range)  naproxen (NAPROSYN) tablet 500 mg (has no administration in time range)  amLODipine (NORVASC) tablet 10 mg (has no administration in time range)  pantoprazole (PROTONIX) EC tablet 40 mg (has no administration in time range)  albuterol (VENTOLIN HFA) 108 (90 Base) MCG/ACT inhaler 2-4 puff (has no administration in time range)  0.9 %  sodium chloride infusion (Manually program via Guardrails IV Fluids) (0 mLs Intravenous Stopped 09/24/21 1528)    Medical Decision Making:    Sharma Lawrance is a 46 y.o. female who presented to the ED today with abnormal  detailed above.     Patient's presentation is complicated by their history of known endometriosis lost to follow-up.   Complete initial physical exam performed, notably the patient  was hemodynamically stable in no acute distress.  She is ambulatory though lightheaded.      Reviewed and confirmed nursing documentation for past medical history, family history, social history.    Initial Assessment:   This is a critically ill female presenting for critical anemia. This is likely secondary to her known uterine fibroids. Patient consented for blood transfusion when her hemoglobin was less than 7.  Patient emergently transfused.  Case discussed with on-call gynecology who left recommendations in chart format.  Plan initially was for transfusion and outpatient follow-up.  Unfortunately, repeat hemoglobin without substantial stabilization of her hemoglobin.  She remains anemic at this time.  An additional unit is ordered and patient be arranged for hospitalization for ongoing care management.  Meglace 40 mg was recommended by gynecologist which was initiated in emergency department.  Patient arranged for  admission requiring frequent reevaluation and ongoing care and management.  Clinical Impression:  1. Anemia, unspecified type      Admit   Final Clinical Impression(s) / ED Diagnoses Final diagnoses:  Anemia, unspecified type    Rx / DC Orders ED Discharge Orders     None         Tretha Sciara, MD 09/24/21 1725

## 2021-09-24 NOTE — Consult Note (Signed)
   OB/GYN Telephone Consult  Martha Garza is a 46 y.o. presenting with AUB.   I was called for a consult regarding the care of this patient by [facility].    The provider had a clinical question regarding management options and follow up for her bleeding.    The provider presented the following relevant clinical information and I performed a chart review on the patient and reviewed available documentation: The patient is a 46 yo who has had longstanding AUB in the setting of fibroids. She had previously been followed by a gynecologist but then it improved and she was lost to follow up. She presented today symptomatic from her bleeding and to be anemic with her Hgb ~6.    I reviewed CareEverywhere: She had an US done at [facility] on 03/01/21 but a report is not available. There she has followed up with Dr. Quitman Livings for this. Per their note, the patient showed a 16x10x10 cm uterus with multiple large fibroids. Her uterus was 24 week size on exam. She was going to have a robotic surgery but was converted to the plan for an open surgery. She was recommended to start Provera and iron while awaiting surgery. It appears (although hard to tell from the chart for sure), that she was scheduled for surgery on 2/7 but did not show or the surgery was cancelled because they could not get in contact with her.   BP (!) 180/91   Pulse 62   Temp 98.6 F (37 C) (Oral)   Resp 14   SpO2 100%   Exam- performed by consulting provider   Recommendations:  - Reviewed can do OCP unless contraindications and in that event would do Megace. Given her blood pressure I would consider Megace over OCP given the poor control on presentation today. Also appears she may use tobacco based on chart review (although this may be not up to date). Megace dose 40 bid until a plan of care is established.  - Message sent to [place] to offer patient follow up with [place] but she may also follow up with her regular OB/GYN.  -Of note,  her pap smear was abnormal in January 2023 and she was called multiple times by that office and sent a letter informing her she needs a colposcopy. 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   -Recommended the patient follow up within the next 4 weeks.   Thank you for this consult and if additional recommendations are needed please call [phone] for the OB/GYN attending on service at [facility].   I spent approximately 5 minutes directly consulting with the provider and verbally discussing this case. Additionally 10 minutes was spent performing chart review and documentation.   Radene Gunning, MD Attending [place], [facility] for [facility], [place]

## 2021-09-24 NOTE — ED Notes (Signed)
BLOOD BANK HAS BLOOD READY NOTIFIED JENNIFER RN

## 2021-09-24 NOTE — ED Notes (Signed)
Pt ambulatory to bathroom w/o assist 

## 2021-09-24 NOTE — ED Notes (Signed)
BLOOD BANK HAS RED BLOOD CELLS READY NOTIFIED JENNIFER (RN)

## 2021-09-24 NOTE — Progress Notes (Signed)
Internal Medicine Clinic Attending  Case discussed with Dr. Marlou Sa  At the time of the visit.  We reviewed the resident's history and exam and pertinent patient test results.  I agree with the assessment, diagnosis, and plan of care documented in the resident's note.     Patient is having symptoms of anemia (fatigue, dyspnea), similar to when she had iron deficiency anemia 2/2 chronic blood loss from heavy periods previously. VSS. Labs today showed Hgb 6.8, so resident team called patient and sent her to the ER for blood transfusion for symptomatic anemia. I agree with this plan. She should also get IV iron in ER if possible, and we can arrange further IV iron outpatient if she's not admitted.

## 2021-09-24 NOTE — ED Triage Notes (Signed)
Pt reports low hgb. Pt states she was sent to ED for blood transfusion due to hgb of 6. Pt does feel weak.

## 2021-09-25 DIAGNOSIS — D649 Anemia, unspecified: Secondary | ICD-10-CM | POA: Diagnosis not present

## 2021-09-25 LAB — BPAM RBC
Blood Product Expiration Date: 202309092359
Blood Product Expiration Date: 202309232359
ISSUE DATE / TIME: 202308251219
ISSUE DATE / TIME: 202308251806
Unit Type and Rh: 5100
Unit Type and Rh: 5100

## 2021-09-25 LAB — TYPE AND SCREEN
ABO/RH(D): O POS
Antibody Screen: NEGATIVE
Unit division: 0
Unit division: 0

## 2021-09-25 LAB — CBC
HCT: 26.3 % — ABNORMAL LOW (ref 36.0–46.0)
Hemoglobin: 7.5 g/dL — ABNORMAL LOW (ref 12.0–15.0)
MCH: 19 pg — ABNORMAL LOW (ref 26.0–34.0)
MCHC: 28.5 g/dL — ABNORMAL LOW (ref 30.0–36.0)
MCV: 66.6 fL — ABNORMAL LOW (ref 80.0–100.0)
Platelets: 257 K/uL (ref 150–400)
RBC: 3.95 MIL/uL (ref 3.87–5.11)
RDW: 21.4 % — ABNORMAL HIGH (ref 11.5–15.5)
WBC: 8.8 K/uL (ref 4.0–10.5)
nRBC: 0 % (ref 0.0–0.2)

## 2021-09-25 LAB — HIV ANTIBODY (ROUTINE TESTING W REFLEX): HIV Screen 4th Generation wRfx: NONREACTIVE

## 2021-09-25 MED ORDER — MEGESTROL ACETATE 40 MG PO TABS: 40.0000 mg | ORAL_TABLET | Freq: Two times a day (BID) | ORAL | 1 refills | Status: AC

## 2021-09-25 MED ORDER — FERROUS SULFATE 325 (65 FE) MG PO TABS: 325.0000 mg | ORAL_TABLET | Freq: Every day | ORAL | 1 refills | Status: AC

## 2021-09-25 MED ORDER — HYDROCHLOROTHIAZIDE 12.5 MG PO TABS: 12.5000 mg | ORAL_TABLET | Freq: Every day | ORAL | 1 refills | Status: AC

## 2021-09-25 NOTE — Discharge Summary (Signed)
Physician Discharge Summary  Martha Garza HER:[id number] DOB: 20-Jan-1976 DOA: 09/24/2021  PCP: Riesa Pope, MD  Admit date: 09/24/2021 Discharge date: 09/25/2021  Admitted From: Home Disposition:  Home  Discharge Condition:Stable CODE STATUS:FULL Diet recommendation: Heart Healthy   Brief/Interim Summary:   Martha Garza is a 46 y.o. female with medical history significant of uterine fibroid causing abnormal uterine bleed, hypertension, iron deficiency who presented to the emergency department after she was found to have low hemoglobin.  She  was feeling weak, fatigue for last few weeks.  Patient was seen at internal medicine clinic , outpatient labs showed hemoglobin of 6.8 and she was sent to the emergency department for blood transfusion.  She was given 2 units of blood transfusion.  Hemoglobin has remained stable in the range of 7 today.  She is medically stable for discharge.  She will follow-up with GYN as an outpatient.  Following problems were addressed during her hospitalization:  Symptomatic anemia: From menorrhagia.  History of uterine fibroid. Transfused with 2 units  PRBC.Hb in the range of 7 today   Uterine fibroid: Has history of longstanding abnormal uterine bleeding in the setting of fibroids.  Ul[facility] done at [facility] showed multiple large fibroids, she was planned for surgery but she did not show up.  Consult was placed with OB/GYN here.  Recommend to start Megace at 40 mg twice daily.  Outpatient follow-up arranged with [facility] in 4 weeks.   Iron deficiency: Secondary to blood loss from vaginal bleeding.  She gets intermittent outpatient IV iron infusion.  Low iron, given a dose of IV iron.  Continue oral iron supplementation   Hypertension: Blood pressure elevated in the emergency department.  Takes amlodipine 10 mg daily at home which we will continue. Added HCTZ     GERD: Continue Protonix  Discharge Diagnoses:  Principal Problem:    Symptomatic anemia Active Problems:   Iron deficiency anemia   Hypertension   Gastroesophageal reflux disease   Uterine fibroid    Discharge Instructions  Discharge Instructions     Diet - low sodium heart healthy   Complete by: As directed    Discharge instructions   Complete by: As directed    1)Please follow up with your PCP in a week,do a CBC test to check your hemoglobin during the follow up 2)Follow up with gynecology as an outpatient. Name and number of the provider group has been attached. You will be called for appointment,if not call to the number attached.You also have option to follow up with your previous gynecologist 3)Take prescribed medications as instructed   Increase activity slowly   Complete by: As directed       Allergies as of 09/25/2021       Reactions   Elimite [permethrin] Hives   Losartan Hives   Penicillins Hives   Did it involve swelling of the face/tongue/throat, SOB, or low BP? No Did it involve sudden or severe rash/hives, skin peeling, or any reaction on the inside of your mouth or nose? Yes Did you need to seek medical attention at a hospital or doctor's office? Unknown When did it last happen?  Childhood     If all above answers are "NO", may proceed with cephalosporin use.        Medication List     STOP taking these medications    diclofenac Sodium 1 % Gel Commonly known as: Voltaren   terbinafine 1 % cream Commonly known as: LAMISIL  TAKE these medications    albuterol 108 (90 Base) MCG/ACT inhaler Commonly known as: VENTOLIN HFA Inhale 2-4 puffs into the lungs every 4 (four) hours as needed for wheezing or shortness of breath.   amLODipine 10 MG tablet Commonly known as: NORVASC Take 1 tablet (10 mg total) by mouth daily.   clobetasol ointment 0.05 % Commonly known as: TEMOVATE Apply 1 Application topically daily as needed (rash).   ferrous sulfate 325 (65 FE) MG tablet Take 1 tablet (325 mg total) by mouth  daily with breakfast. Start taking on: September 26, 2021   megestrol 40 MG tablet Commonly known as: MEGACE Take 1 tablet (40 mg total) by mouth 2 (two) times daily.   naproxen 500 MG tablet Commonly known as: NAPROSYN Take 500 mg by mouth daily as needed for moderate pain.   pantoprazole 40 MG tablet Commonly known as: Protonix Take 1 tablet (40 mg total) by mouth daily.        Follow-up Information     [facility]. Schedule an appointment as soon as possible for a visit in 4 week(s).   Contact information: [address] [place] [address] [phone]        Riesa Pope, MD. Schedule an appointment as soon as possible for a visit in 1 week(s).   Specialty: Internal Medicine Contact information: [address] [place] [address] [phone]                Allergies  Allergen Reactions   Elimite [Permethrin] Hives   Losartan Hives   Penicillins Hives    Did it involve swelling of the face/tongue/throat, SOB, or low BP? No Did it involve sudden or severe rash/hives, skin peeling, or any reaction on the inside of your mouth or nose? Yes Did you need to seek medical attention at a hospital or doctor's office? Unknown When did it last happen?  Childhood     If all above answers are "NO", may proceed with cephalosporin use.      Consultations: None   Procedures/Studies: No results found.    Subjective: Patient seen and examined at the bedside this morning.  Hemodynamically stable for discharge.  Discharge Exam: Vitals:   09/25/21 0719 09/25/21 0800  BP: (!) 144/81 (!) 183/113  Pulse: (!) 57 62  Resp: 18 19  Temp: 97.9 F (36.6 C)   SpO2: 98% 97%   Vitals:   09/25/21 0615 09/25/21 0630 09/25/21 0719 09/25/21 0800  BP:  (!) 161/77 (!) 144/81 (!) 183/113  Pulse: 74 67 (!) 57 62  Resp:  '20 18 19  '$ Temp:   97.9 F (36.6 C)   TempSrc:   Oral   SpO2: 100% 96% 98% 97%    General: Pt is  alert, awake, not in acute distress Cardiovascular: RRR, S1/S2 +, no rubs, no gallops Respiratory: CTA bilaterally, no wheezing, no rhonchi Abdominal: Soft, NT, ND, bowel sounds + Extremities: no edema, no cyanosis    The results of significant diagnostics from this hospitalization (including imaging, microbiology, ancillary and laboratory) are listed below for reference.     Microbiology: No results found for this or any previous visit (from the past 240 hour(s)).   Labs: BNP (last 3 results) No results for input(s): "BNP" in the last 8760 hours. Basic Metabolic Panel: Recent Labs  Lab 09/24/21 2055  NA 139  K 4.0  CL 107  CO2 26  GLUCOSE 99  BUN 8  CREATININE 0.74  CALCIUM  9.2   Liver Function Tests: No results for input(s): "AST", "ALT", "ALKPHOS", "BILITOT", "PROT", "ALBUMIN" in the last 168 hours. No results for input(s): "LIPASE", "AMYLASE" in the last 168 hours. No results for input(s): "AMMONIA" in the last 168 hours. CBC: Recent Labs  Lab 09/23/21 1640 09/24/21 1006 09/24/21 1610 09/25/21 0424  WBC 6.6 5.9  --  8.8  HGB 6.8* 6.3* 6.9* 7.5*  HCT 24.8* 23.5* 24.5* 26.3*  MCV 62* 63.7*  --  66.6*  PLT 314 285  --  257   Cardiac Enzymes: No results for input(s): "CKTOTAL", "CKMB", "CKMBINDEX", "TROPONINI" in the last 168 hours. BNP: Invalid input(s): "POCBNP" CBG: No results for input(s): "GLUCAP" in the last 168 hours. D-Dimer No results for input(s): "DDIMER" in the last 72 hours. Hgb A1c No results for input(s): "HGBA1C" in the last 72 hours. Lipid Profile No results for input(s): "CHOL", "HDL", "LDLCALC", "TRIG", "CHOLHDL", "LDLDIRECT" in the last 72 hours. Thyroid function studies No results for input(s): "TSH", "T4TOTAL", "T3FREE", "THYROIDAB" in the last 72 hours.  Invalid input(s): "FREET3" Anemia work up Recent Labs    09/23/21 1640  FERRITIN 4*  TIBC 449  IRON 10*   Urinalysis    Component Value Date/Time   COLORURINE YELLOW  05/27/2019 1434   APPEARANCEUR CLEAR 05/27/2019 1434   LABSPEC 1.019 05/27/2019 1434   PHURINE 5.0 05/27/2019 1434   GLUCOSEU NEGATIVE 05/27/2019 1434   [place] 05/27/2019 1434   [place] 05/27/2019 1434   KETONESUR NEGATIVE 05/27/2019 1434   PROTEINUR 30 (A) 05/27/2019 1434   NITRITE NEGATIVE 05/27/2019 1434   LEUKOCYTESUR TRACE (A) 05/27/2019 1434   Sepsis Labs Recent Labs  Lab 09/23/21 1640 09/24/21 1006 09/25/21 0424  WBC 6.6 5.9 8.8   Microbiology No results found for this or any previous visit (from the past 240 hour(s)).  Please note: You were cared for by a hospitalist during your hospital stay. Once you are discharged, your primary care physician will handle any further medical issues. Please note that NO REFILLS for any discharge medications will be authorized once you are discharged, as it is imperative that you return to your primary care physician (or establish a relationship with a primary care physician if you do not have one) for your post hospital discharge needs so that they can reassess your need for medications and monitor your lab values.    Time coordinating discharge: 40 minutes  SIGNED:   Shelly Coss, MD  [facility] 09/25/2021, 9:00 AM Pager [phone]  If 7PM-7AM, please contact night-coverage [email or web address] Password [email or web address]

## 2021-09-25 NOTE — Progress Notes (Signed)
TOC CSW spoke with TOC RN, CM and  pt is insured, therefore pt does not meet criteria for medication assistance under the match program.  Rhylin Venters Tarpley-Carter, MSW, LCSW-A Pronouns:  She/Her/Hers [facility] HealthTransitions of Care Clinical Social Worker Direct Number:  [phone] [email or web address]

## 2021-11-17 ENCOUNTER — Ambulatory Visit: Payer: BC Managed Care – PPO | Admitting: Obstetrics and Gynecology

## 2021-11-24 ENCOUNTER — Other Ambulatory Visit: Payer: Self-pay

## 2021-11-24 MED ORDER — NAPROXEN 500 MG PO TABS: 500.0000 mg | ORAL_TABLET | Freq: Two times a day (BID) | ORAL | 0 refills | Status: AC | PRN

## 2021-11-24 NOTE — Telephone Encounter (Signed)
naproxen (NAPROSYN) 500 MG tablet , refill request @ [facility] on [address].

## 2021-12-06 NOTE — Progress Notes (Deleted)
HTN Amlodipine 10 HCTZ 12.5  Moderate persistent asthma Albuterol  GERD Protonix  Psoriasis She was prescribed topical steroids, instructed to stop terbinafine, and also prescribe methotrexate 10 mg once weekly for 30 days. She was also started on iron-folic FREV-Q00 (Ferrex 150 Forte) 150-1-25 supplementation and for itching prescribed hydroxyzine in addition to the Zyrtec previously prescribed.  She has follow-up with derm on 06/05/20.   Severe iron deficiency anemia ED 09/24/21 for this and gyn thought due to fibroid. Started on megace 09/23/21 TIBC 439, iron 10, iron sat 2, ferritin 4, hgb 6.8  HCM Flu Pap HCV TDAP

## 2022-04-27 IMAGING — CR DG HAND COMPLETE 3+V*R*
3 series · 3 of 3 positions shown · non-contrast
Comparison: None.

CLINICAL DATA: Third MCP cellulitis with new lateral extension of
tenderness.

EXAM:
RIGHT HAND - COMPLETE 3+ VIEW

[hand pa]
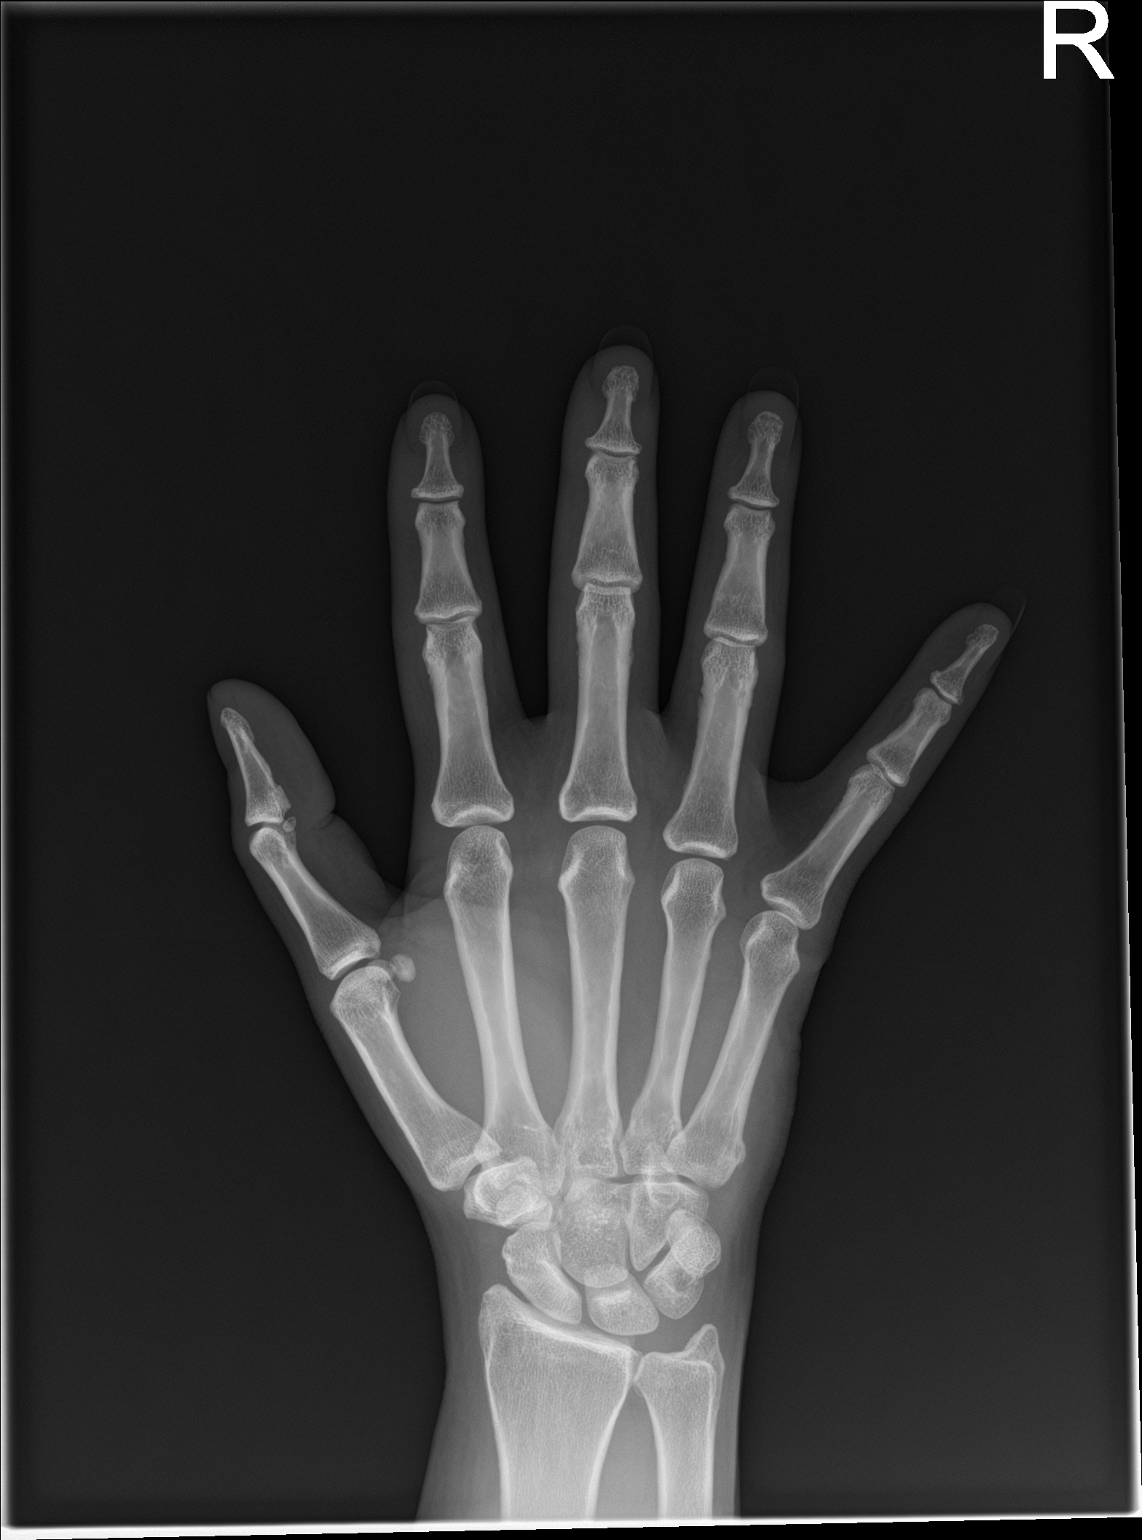

[hand obl]
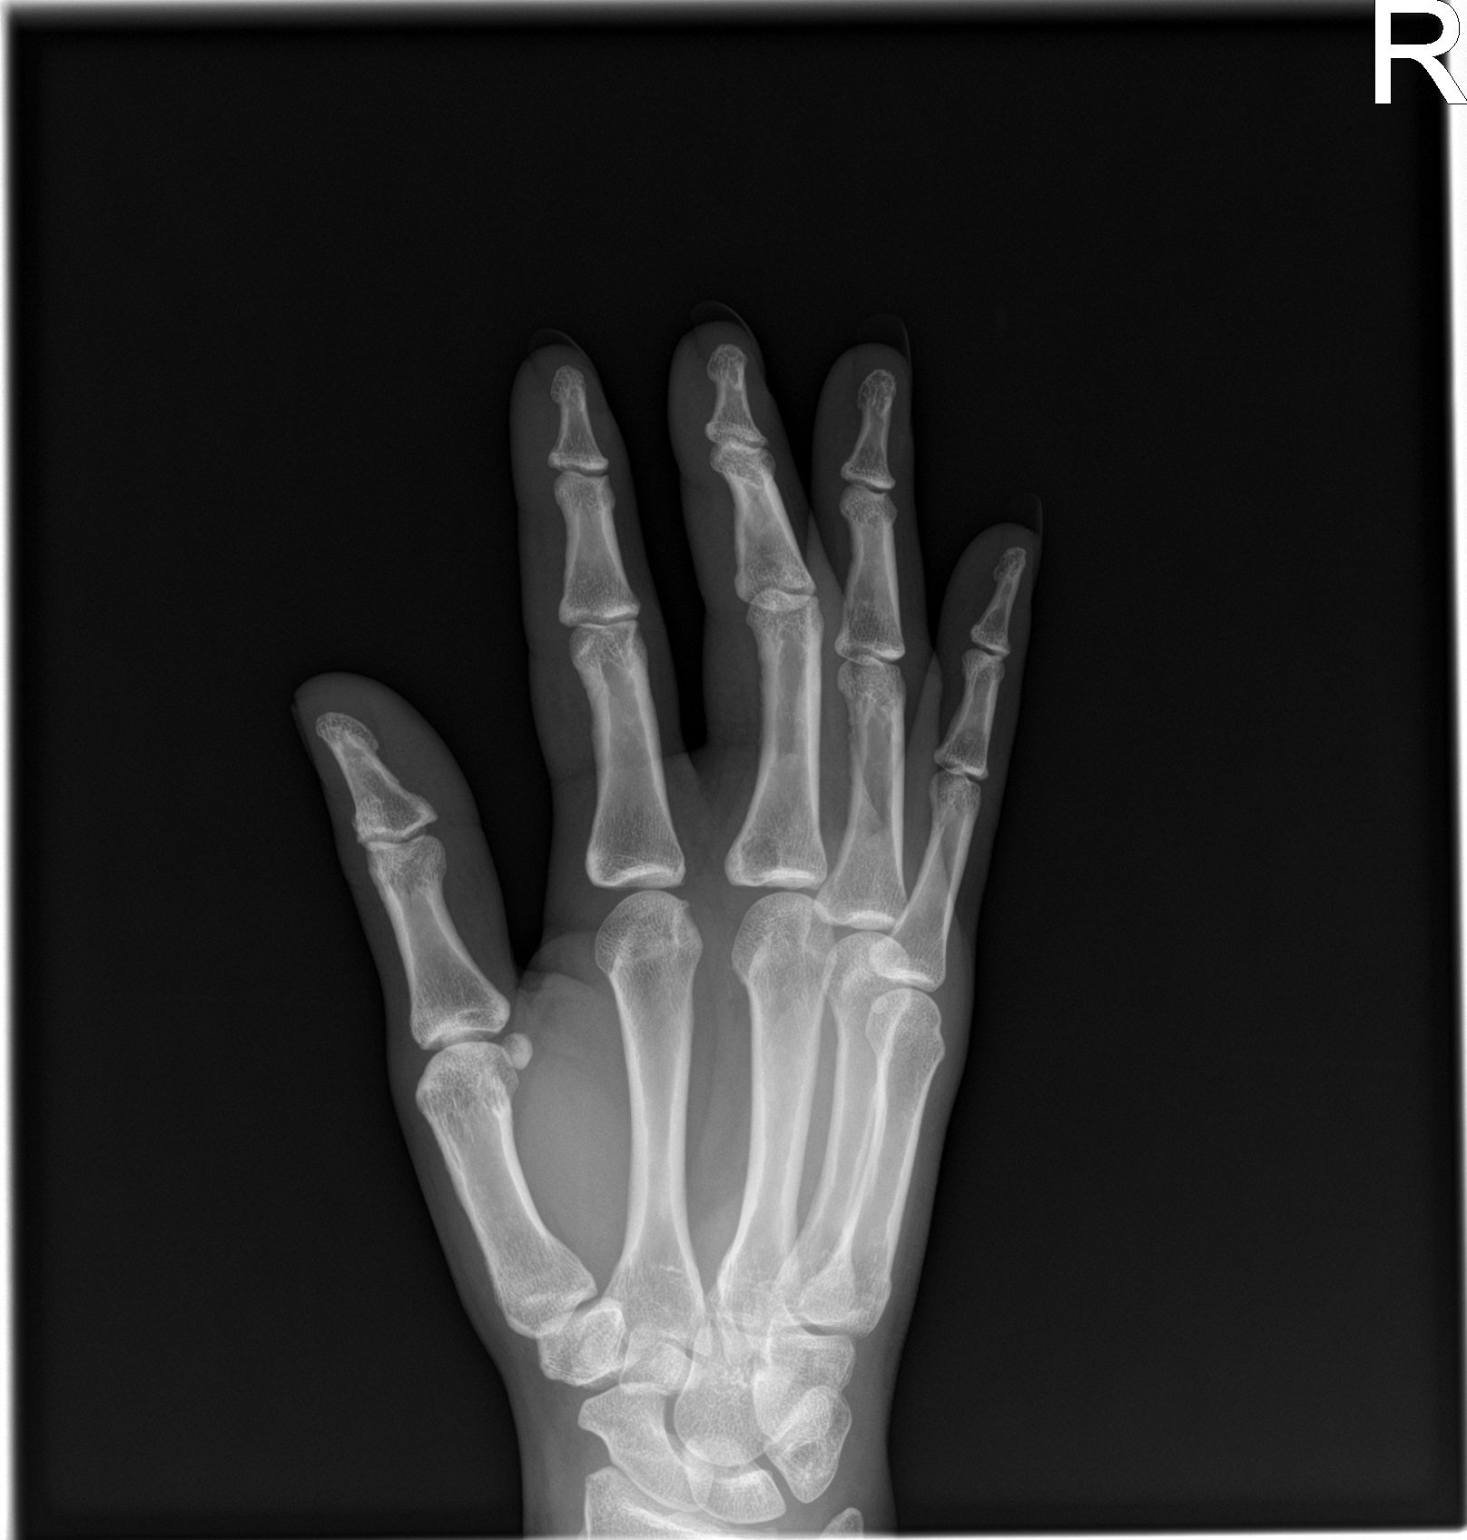

[hand lat]
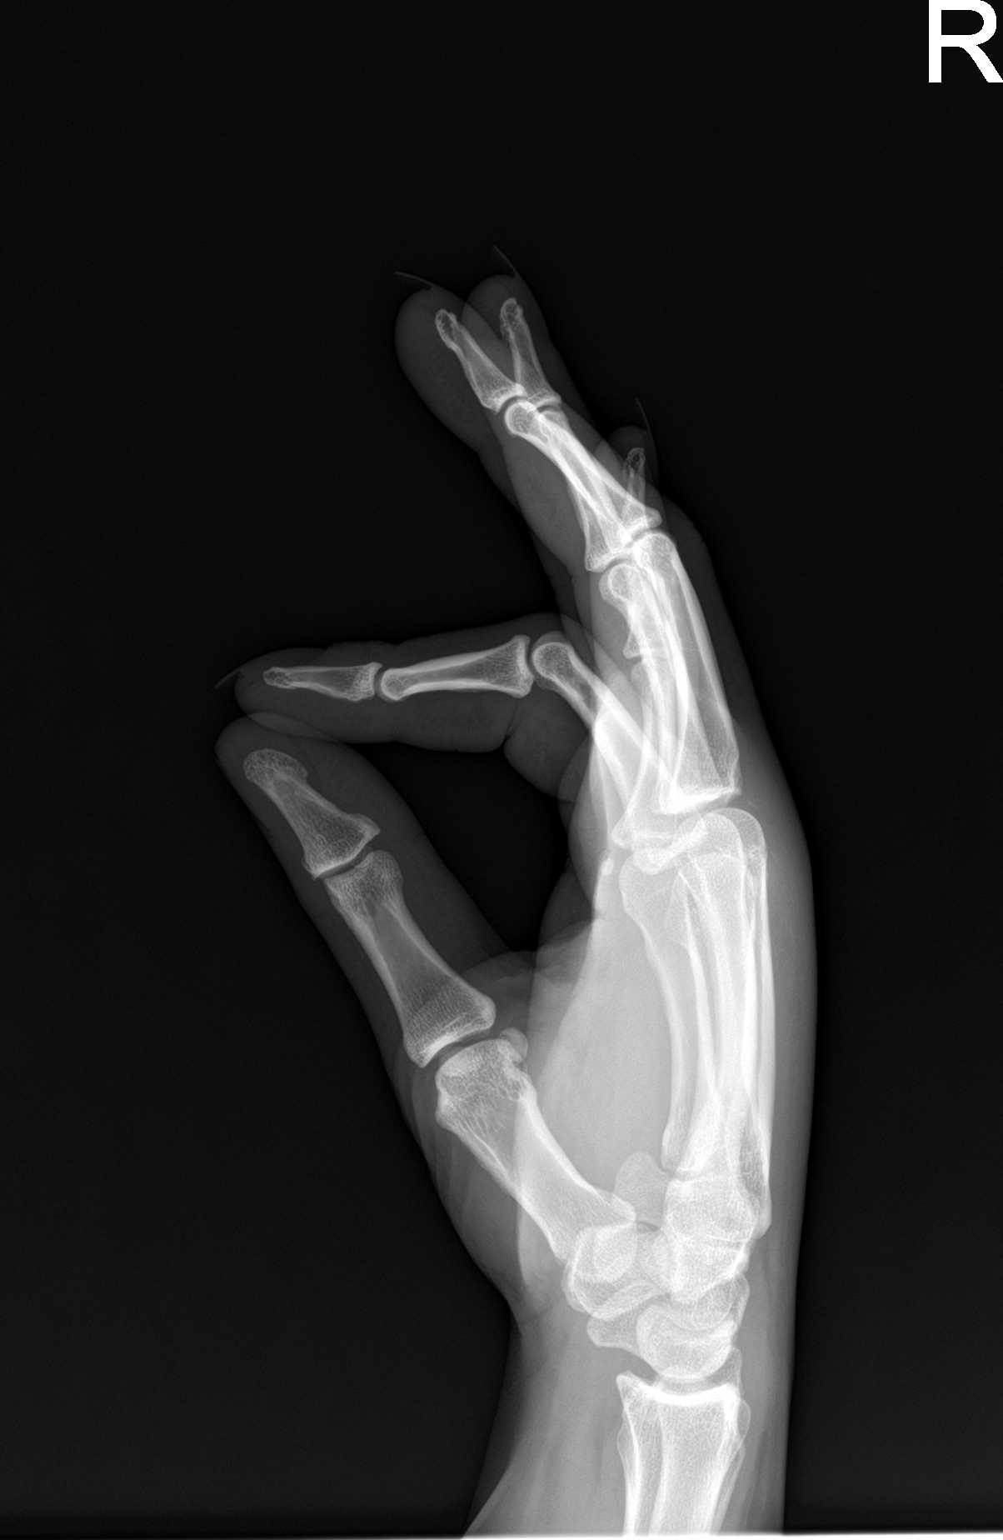

[3 of 3 positions shown; findings below may reference images not displayed]

FINDINGS: There is no evidence of fracture or dislocation. There is no
evidence of arthropathy or other focal bone abnormality. Soft
tissues are unremarkable.
IMPRESSION: No acute osseous abnormality, specifically no radiographic evidence
of osteomyelitis.
